# Patient Record
Sex: Female | Born: 1975 | Race: Black or African American | Hispanic: No | Marital: Single | Smoking: Former smoker
Health system: Southern US, Community
[De-identification: ages and names within clinical notes are randomized; demographics above are authoritative.]

## PROBLEM LIST (undated history)

## (undated) ENCOUNTER — Inpatient Hospital Stay (HOSPITAL_COMMUNITY): Payer: Self-pay

## (undated) DIAGNOSIS — T7840XA Allergy, unspecified, initial encounter: Secondary | ICD-10-CM

## (undated) DIAGNOSIS — J329 Chronic sinusitis, unspecified: Secondary | ICD-10-CM

## (undated) DIAGNOSIS — R87629 Unspecified abnormal cytological findings in specimens from vagina: Secondary | ICD-10-CM

## (undated) DIAGNOSIS — O24419 Gestational diabetes mellitus in pregnancy, unspecified control: Secondary | ICD-10-CM

## (undated) DIAGNOSIS — E079 Disorder of thyroid, unspecified: Secondary | ICD-10-CM

## (undated) HISTORY — DX: Unspecified abnormal cytological findings in specimens from vagina: R87.629

## (undated) HISTORY — DX: Disorder of thyroid, unspecified: E07.9

## (undated) HISTORY — DX: Gestational diabetes mellitus in pregnancy, unspecified control: O24.419

## (undated) HISTORY — DX: Allergy, unspecified, initial encounter: T78.40XA

---

## 2002-09-05 HISTORY — PX: COLPOSCOPY: SHX161

## 2003-08-05 ENCOUNTER — Other Ambulatory Visit: Admission: RE | Admit: 2003-08-05 | Discharge: 2003-08-05 | Payer: Self-pay | Admitting: Obstetrics and Gynecology

## 2004-03-16 ENCOUNTER — Encounter: Admission: RE | Admit: 2004-03-16 | Discharge: 2004-03-16 | Payer: Self-pay | Admitting: Obstetrics & Gynecology

## 2007-09-11 ENCOUNTER — Encounter: Admission: RE | Admit: 2007-09-11 | Discharge: 2007-09-11 | Payer: Self-pay | Admitting: Obstetrics and Gynecology

## 2007-10-09 ENCOUNTER — Inpatient Hospital Stay (HOSPITAL_COMMUNITY): Admission: AD | Admit: 2007-10-09 | Discharge: 2007-10-09 | Payer: Self-pay | Admitting: Obstetrics and Gynecology

## 2007-10-11 ENCOUNTER — Inpatient Hospital Stay (HOSPITAL_COMMUNITY): Admission: AD | Admit: 2007-10-11 | Discharge: 2007-10-11 | Payer: Self-pay | Admitting: Obstetrics and Gynecology

## 2007-11-02 ENCOUNTER — Inpatient Hospital Stay (HOSPITAL_COMMUNITY): Admission: AD | Admit: 2007-11-02 | Discharge: 2007-11-05 | Payer: Self-pay | Admitting: Obstetrics and Gynecology

## 2008-01-04 ENCOUNTER — Ambulatory Visit: Payer: Self-pay | Admitting: Family Medicine

## 2008-02-01 ENCOUNTER — Ambulatory Visit: Payer: Self-pay | Admitting: Family Medicine

## 2008-08-26 ENCOUNTER — Ambulatory Visit: Payer: Self-pay | Admitting: Family Medicine

## 2008-09-09 ENCOUNTER — Ambulatory Visit: Payer: Self-pay | Admitting: Family Medicine

## 2010-09-05 NOTE — L&D Delivery Note (Signed)
Delivery Note At 11:50 AM a viable and healthy female was delivered via Vaginal, Spontaneous Delivery (Presentation:OA ;  ).  APGAR: 9, 9; weight 7 lb 14.2 oz (3578 g).   Placenta status: spont intact not sent, .  Cord:  with the following complications: cord around neck loose reducible.  Cord pH: none  Anesthesia: Epidural  Episiotomy: None Lacerations: None Suture Repair: none Est. Blood Loss (mL): 300  Mom to postpartum.  Baby to nursery-stable.  [name] A 08/04/2011, 12:04 PM

## 2010-10-04 ENCOUNTER — Ambulatory Visit
Admission: RE | Admit: 2010-10-04 | Discharge: 2010-10-04 | Payer: Self-pay | Source: Home / Self Care | Attending: Family Medicine | Admitting: Family Medicine

## 2010-11-30 ENCOUNTER — Observation Stay (HOSPITAL_COMMUNITY)
Admission: EM | Admit: 2010-11-30 | Discharge: 2010-12-01 | Disposition: A | Discharge status: Home / Self Care | Payer: 59 | Attending: Emergency Medicine | Admitting: Emergency Medicine

## 2010-11-30 DIAGNOSIS — O211 Hyperemesis gravidarum with metabolic disturbance: Principal | ICD-10-CM | POA: Insufficient documentation

## 2010-11-30 DIAGNOSIS — E860 Dehydration: Secondary | ICD-10-CM | POA: Insufficient documentation

## 2010-11-30 LAB — DIFFERENTIAL
Basophils Relative: 0 % (ref 0–1)
Eosinophils Absolute: 0 10*3/uL (ref 0.0–0.7)
Neutrophils Relative %: 71 % (ref 43–77)

## 2010-11-30 LAB — BASIC METABOLIC PANEL
CO2: 27 mEq/L (ref 19–32)
Calcium: 8.5 mg/dL (ref 8.4–10.5)
Chloride: 96 mEq/L (ref 96–112)
Glucose, Bld: 131 mg/dL — ABNORMAL HIGH (ref 70–99)
Sodium: 132 mEq/L — ABNORMAL LOW (ref 135–145)

## 2010-11-30 LAB — URINALYSIS, ROUTINE W REFLEX MICROSCOPIC
Ketones, ur: >80 mg/dL — AB
Nitrite: NEGATIVE
Specific Gravity, Urine: 1.015 (ref 1.005–1.030)
pH: 6 (ref 5.0–8.0)

## 2010-11-30 LAB — CBC
MCH: 28.2 pg (ref 26.0–34.0)
Platelets: 272 10*3/uL (ref 150–400)
RBC: 4.4 MIL/uL (ref 3.87–5.11)
WBC: 15.4 10*3/uL — ABNORMAL HIGH (ref 4.0–10.5)

## 2010-11-30 LAB — URINE MICROSCOPIC-ADD ON

## 2010-12-01 LAB — POCT I-STAT, CHEM 8
BUN: <3 mg/dL — ABNORMAL LOW (ref 6–23)
HCT: 29 % — ABNORMAL LOW (ref 36.0–46.0)
Hemoglobin: 9.9 g/dL — ABNORMAL LOW (ref 12.0–15.0)
Sodium: 141 mEq/L (ref 135–145)
TCO2: 23 mmol/L (ref 0–100)

## 2010-12-01 LAB — URINE CULTURE
Colony Count: NO GROWTH
Culture  Setup Time: 201203271738
Culture: NO GROWTH

## 2011-02-22 LAB — HEPATITIS B SURFACE ANTIGEN: Hepatitis B Surface Ag: NEGATIVE

## 2011-02-22 LAB — ANTIBODY SCREEN: Antibody Screen: NEGATIVE

## 2011-02-22 LAB — HIV ANTIBODY (ROUTINE TESTING W REFLEX): HIV: NONREACTIVE

## 2011-03-16 ENCOUNTER — Encounter: Payer: 59 | Attending: Obstetrics and Gynecology

## 2011-03-16 DIAGNOSIS — Z713 Dietary counseling and surveillance: Secondary | ICD-10-CM | POA: Insufficient documentation

## 2011-03-16 DIAGNOSIS — O99810 Abnormal glucose complicating pregnancy: Secondary | ICD-10-CM | POA: Insufficient documentation

## 2011-03-17 NOTE — Progress Notes (Signed)
  Patient was seen on 03/16/2011 for Gestational Diabetes self-management class at the [facility]. The following learning objectives were met by the patient during this course:   States the definition of Gestational Diabetes  States why dietary management is important in controlling blood glucose  Describes the effects each nutrient has on blood glucose levels  Demonstrates ability to create a balanced meal plan  Demonstrates carbohydrate counting   States when to check blood glucose levels  Demonstrates proper blood glucose monitoring techniques  States the effect of stress and exercise on blood glucose levels  States the importance of limiting caffeine and abstaining from alcohol and smoking  Blood glucose monitor given: Accu-Check-Nano  Lot # [id number] Exp: 07/05/2012 Blood glucose reading: 110  Patient instructed to monitor glucose levels: FBS: 60 - <90 2 hour: <120  Patient will be seen for follow-up as needed.

## 2011-05-02 ENCOUNTER — Encounter: Payer: Self-pay | Admitting: Family Medicine

## 2011-05-27 LAB — CBC
HCT: 35.7 — ABNORMAL LOW
MCHC: 34
MCV: 85.7
Platelets: 292
RDW: 15.4

## 2011-05-27 LAB — STREP B DNA PROBE

## 2011-05-27 LAB — RPR: RPR Ser Ql: NONREACTIVE

## 2011-05-30 LAB — CBC
MCHC: 34.1
Platelets: 252
RDW: 15.3

## 2011-06-30 ENCOUNTER — Encounter: Payer: Self-pay | Admitting: Internal Medicine

## 2011-07-01 ENCOUNTER — Ambulatory Visit (INDEPENDENT_AMBULATORY_CARE_PROVIDER_SITE_OTHER)
Admission: RE | Admit: 2011-07-01 | Discharge: 2011-07-01 | Disposition: A | Discharge status: Home / Self Care | Payer: 59 | Source: Ambulatory Visit | Attending: Internal Medicine | Admitting: Internal Medicine

## 2011-07-01 ENCOUNTER — Encounter: Payer: Self-pay | Admitting: Internal Medicine

## 2011-07-01 ENCOUNTER — Ambulatory Visit (INDEPENDENT_AMBULATORY_CARE_PROVIDER_SITE_OTHER): Payer: 59 | Admitting: Internal Medicine

## 2011-07-01 VITALS — BP 100/64 | HR 80 | Temp 98.3°F | Ht 67.0 in | Wt 184.0 lb

## 2011-07-01 DIAGNOSIS — R0609 Other forms of dyspnea: Secondary | ICD-10-CM

## 2011-07-01 DIAGNOSIS — R0600 Dyspnea, unspecified: Secondary | ICD-10-CM

## 2011-07-01 NOTE — Patient Instructions (Signed)
No evidence of asthma, blood clots or heart failure - you may just need to pace yourself a little slower as you gain more weight with your pregnancy  You need an up to date cbc and bmet and tsh to be complete   If you notice  A lot more difficulty walk surface at the pace you did for [place] today we need to see you right away.

## 2011-07-01 NOTE — Progress Notes (Signed)
Quick Note:  Spoke with pt and notified of results per Dr. Wert. Pt verbalized understanding and denied any questions.  ______ 

## 2011-07-01 NOTE — Progress Notes (Signed)
  Subjective:    Patient ID: Pamela [name], female    DOB: 22-Aug-1976, 35 y.o.   MRN: [id number]  HPI  27 yobf never smoker never sign respiratory problems including 2009 with 1st pregnancy referred to pulmonary clinic by Dr Cherly Hensen for sob Oct 2012 onset at [redacted] weeks gestation.   07/01/2011 Initial pulmonary office eval @ 34 weeks gestaton  cc Indolent onset persistent not progressive x 4 weeks sense of sob typically in am  [place] but directly related or proportional  to activity, sometimes takes an hour to recover And also when lie down now has to lie on 2 pillows not true with first pregnancy maybe 5 lbs heavier on this one. . Nasal congestion no worse than usual.  No ex/ pleuritic cp or leg swelling or h/o hbp or proteinuria with this pregnancy nor last one.   Denies  early am exacerbation  of respiratory  c/o's and has never used saba. Also denies any obvious fluctuation of symptoms with weather or environmental changes or other aggravating or alleviating factors except as outlined above   Review of Systems  Constitutional: Negative for fever, chills and unexpected weight change.  HENT: Positive for congestion and sneezing. Negative for ear pain, nosebleeds, sore throat, rhinorrhea, trouble swallowing, dental problem, voice change, postnasal drip and sinus pressure.   Eyes: Negative for visual disturbance.  Respiratory: Positive for shortness of breath. Negative for cough and choking.   Cardiovascular: Negative for chest pain and leg swelling.  Gastrointestinal: Negative for vomiting, abdominal pain and diarrhea.  Genitourinary: Negative for difficulty urinating.  Musculoskeletal: Negative for arthralgias.  Skin: Negative for rash.  Neurological: Negative for tremors, syncope and headaches.  Hematological: Does not bruise/bleed easily.       Objective:   Physical Exam  amb bf nad  Wt 184 07/01/2011  ] HEENT: nl dentition, turbinates, and orophanx. Nl external ear canals without  cough reflex   NECK :  without JVD/Nodes/TM/ nl carotid upstrokes bilaterally   LUNGS: no acc muscle use, clear to A and P bilaterally without cough on insp or exp maneuvers   CV:  RRR  no s3 or murmur or increase in P2, no edema   ABD:  soft and nontender with nl excursion in the supine position c/w near term pregnancy. No bruits or organomegaly, bowel sounds nl  MS:  warm without deformities, calf tenderness, cyanosis or clubbing  SKIN: warm and dry without lesions    NEURO:  alert, approp, no deficits     CXR  07/01/2011 :  Lateral aspect of the right lung is not completely incorporate on present exam. Taking this limitation into account, no evidence of infiltrate, congestive heart failure or pneumothorax.      Assessment & Plan:

## 2011-07-01 NOTE — Assessment & Plan Note (Signed)
Symptoms are markedly disproportionate to objective findings and not clear this is a lung problem but pt does appear to have difficult airway management issues.   DDX of  difficult airways managment all start with A and  include Adherence, Ace Inhibitors, Acid Reflux, Active Sinus Disease, Alpha 1 Antitripsin deficiency, Anxiety masquerading as Airways dz,  ABPA,  allergy(esp in young), Aspiration (esp in elderly), Adverse effects of DPI,  Active smokers, plus two Bs  = Bronchiectasis and Beta blocker use..and one C= CHF   ? Anxiety, dx of exclusion but may be playing a role along with effects of progesterone, wt gain in abd compartment  ? Acid reflux > consider trial of ppi if worsens  See instructions for specific recommendations which were reviewed directly with the patient who was given a copy with highlighter outlining the key components.

## 2011-07-25 ENCOUNTER — Encounter (HOSPITAL_COMMUNITY): Payer: Self-pay | Admitting: *Deleted

## 2011-07-25 ENCOUNTER — Telehealth (HOSPITAL_COMMUNITY): Payer: Self-pay | Admitting: *Deleted

## 2011-07-25 NOTE — Telephone Encounter (Signed)
Preadmission screen  

## 2011-08-04 ENCOUNTER — Encounter (HOSPITAL_COMMUNITY): Payer: Self-pay

## 2011-08-04 ENCOUNTER — Encounter (HOSPITAL_COMMUNITY): Payer: Self-pay | Admitting: Anesthesiology

## 2011-08-04 ENCOUNTER — Inpatient Hospital Stay (HOSPITAL_COMMUNITY)
Admission: AD | Admit: 2011-08-04 | Discharge: 2011-08-06 | DRG: 775 | Disposition: A | Discharge status: Home / Self Care | Payer: 59 | Source: Ambulatory Visit | Attending: Obstetrics and Gynecology | Admitting: Obstetrics and Gynecology

## 2011-08-04 ENCOUNTER — Inpatient Hospital Stay (HOSPITAL_COMMUNITY): Payer: 59 | Admitting: Anesthesiology

## 2011-08-04 DIAGNOSIS — IMO0001 Reserved for inherently not codable concepts without codable children: Secondary | ICD-10-CM

## 2011-08-04 DIAGNOSIS — O99814 Abnormal glucose complicating childbirth: Principal | ICD-10-CM | POA: Diagnosis present

## 2011-08-04 DIAGNOSIS — R0600 Dyspnea, unspecified: Secondary | ICD-10-CM

## 2011-08-04 DIAGNOSIS — O24419 Gestational diabetes mellitus in pregnancy, unspecified control: Secondary | ICD-10-CM | POA: Diagnosis present

## 2011-08-04 DIAGNOSIS — O9903 Anemia complicating the puerperium: Secondary | ICD-10-CM | POA: Diagnosis not present

## 2011-08-04 DIAGNOSIS — D62 Acute posthemorrhagic anemia: Secondary | ICD-10-CM | POA: Diagnosis not present

## 2011-08-04 LAB — CBC
HCT: 37 % (ref 36.0–46.0)
HCT: 30.6 % — ABNORMAL LOW (ref 36.0–46.0)
Hemoglobin: 12.3 g/dL (ref 12.0–15.0)
Hemoglobin: 9.8 g/dL — ABNORMAL LOW (ref 12.0–15.0)
MCH: 28.3 pg (ref 26.0–34.0)
MCHC: 33.2 g/dL (ref 30.0–36.0)
MCHC: 32 g/dL (ref 30.0–36.0)
MCV: 85.3 fL (ref 78.0–100.0)
MCV: 86 fL (ref 78.0–100.0)
Platelets: 299 10*3/uL (ref 150–400)
RDW: 15.5 % (ref 11.5–15.5)
WBC: 7.3 10*3/uL (ref 4.0–10.5)

## 2011-08-04 LAB — COMPREHENSIVE METABOLIC PANEL
Alkaline Phosphatase: 116 U/L (ref 39–117)
BUN: 6 mg/dL (ref 6–23)
Creatinine, Ser: 0.62 mg/dL (ref 0.50–1.10)
GFR calc Af Amer: >90 mL/min (ref >90)
Glucose, Bld: 155 mg/dL — ABNORMAL HIGH (ref 70–99)
Potassium: 3.5 mEq/L (ref 3.5–5.1)
Total Bilirubin: 0.3 mg/dL (ref 0.3–1.2)
Total Protein: 5.6 g/dL — ABNORMAL LOW (ref 6.0–8.3)

## 2011-08-04 LAB — ABO/RH: ABO/RH(D): O POS

## 2011-08-04 LAB — GLUCOSE, CAPILLARY: Glucose-Capillary: 100 mg/dL — ABNORMAL HIGH (ref 70–99)

## 2011-08-04 LAB — RPR: RPR Ser Ql: NONREACTIVE

## 2011-08-04 LAB — URIC ACID: Uric Acid, Serum: 4.8 mg/dL (ref 2.4–7.0)

## 2011-08-04 MED ORDER — EPHEDRINE 5 MG/ML INJ
10.0000 mg | INTRAVENOUS | Status: DC | PRN
  Administered 2011-08-04: 10 mg via INTRAVENOUS
  Filled 2011-08-04: qty 4

## 2011-08-04 MED ORDER — IBUPROFEN 600 MG PO TABS: 600.0000 mg | ORAL_TABLET | Freq: Four times a day (QID) | ORAL | Status: DC | PRN

## 2011-08-04 MED ORDER — FLEET ENEMA 7-19 GM/118ML RE ENEM: 1.0000 | ENEMA | RECTAL | Status: DC | PRN

## 2011-08-04 MED ORDER — CITRIC ACID-SODIUM CITRATE 334-500 MG/5ML PO SOLN: 30.0000 mL | ORAL | Status: DC | PRN

## 2011-08-04 MED ORDER — LACTATED RINGERS IV SOLN
500.0000 mL | INTRAVENOUS | Status: DC | PRN
  Administered 2011-08-04: 300 mL via INTRAVENOUS

## 2011-08-04 MED ORDER — FENTANYL 2.5 MCG/ML BUPIVACAINE 1/10 % EPIDURAL INFUSION (WH - ANES)
14.0000 mL/h | INTRAMUSCULAR | Status: DC
  Administered 2011-08-04: 14 mL/h via EPIDURAL
  Filled 2011-08-04: qty 60

## 2011-08-04 MED ORDER — LACTATED RINGERS IV SOLN
INTRAVENOUS | Status: DC
  Administered 2011-08-04: 125 mL/h via INTRAVENOUS
  Administered 2011-08-04: 07:00:00 via INTRAVENOUS

## 2011-08-04 MED ORDER — LACTATED RINGERS IV SOLN
500.0000 mL | Freq: Once | INTRAVENOUS | Status: AC
  Administered 2011-08-04: 500 mL via INTRAVENOUS

## 2011-08-04 MED ORDER — DIPHENHYDRAMINE HCL 50 MG/ML IJ SOLN: 12.5000 mg | INTRAMUSCULAR | Status: DC | PRN

## 2011-08-04 MED ORDER — OXYTOCIN 20 UNITS IN LACTATED RINGERS INFUSION - SIMPLE
1.0000 m[IU]/min | INTRAVENOUS | Status: DC
  Administered 2011-08-04: 2 m[IU]/min via INTRAVENOUS

## 2011-08-04 MED ORDER — BENZOCAINE-MENTHOL 20-0.5 % EX AERO
INHALATION_SPRAY | CUTANEOUS | Status: AC
  Administered 2011-08-04: 1 via TOPICAL
  Filled 2011-08-04: qty 56

## 2011-08-04 MED ORDER — OXYCODONE-ACETAMINOPHEN 5-325 MG PO TABS: 2.0000 | ORAL_TABLET | ORAL | Status: DC | PRN

## 2011-08-04 MED ORDER — EPHEDRINE 5 MG/ML INJ: 10.0000 mg | INTRAVENOUS | Status: DC | PRN

## 2011-08-04 MED ORDER — PHENYLEPHRINE 40 MCG/ML (10ML) SYRINGE FOR IV PUSH (FOR BLOOD PRESSURE SUPPORT): 80.0000 ug | PREFILLED_SYRINGE | INTRAVENOUS | Status: DC | PRN

## 2011-08-04 MED ORDER — LANOLIN HYDROUS EX OINT: TOPICAL_OINTMENT | CUTANEOUS | Status: DC | PRN

## 2011-08-04 MED ORDER — OXYTOCIN 20 UNITS IN LACTATED RINGERS INFUSION - SIMPLE: 125.0000 mL/h | Freq: Once | INTRAVENOUS | Status: DC

## 2011-08-04 MED ORDER — ZOLPIDEM TARTRATE 5 MG PO TABS: 5.0000 mg | ORAL_TABLET | Freq: Every evening | ORAL | Status: DC | PRN

## 2011-08-04 MED ORDER — WITCH HAZEL-GLYCERIN EX PADS: 1.0000 "application " | MEDICATED_PAD | CUTANEOUS | Status: DC | PRN

## 2011-08-04 MED ORDER — TETANUS-DIPHTH-ACELL PERTUSSIS 5-2.5-18.5 LF-MCG/0.5 IM SUSP: 0.5000 mL | Freq: Once | INTRAMUSCULAR | Status: DC

## 2011-08-04 MED ORDER — FERROUS SULFATE 325 (65 FE) MG PO TABS
325.0000 mg | ORAL_TABLET | Freq: Two times a day (BID) | ORAL | Status: DC
  Administered 2011-08-04 – 2011-08-06 (×4): 325 mg via ORAL
  Filled 2011-08-04 (×4): qty 1

## 2011-08-04 MED ORDER — ONDANSETRON HCL 4 MG PO TABS: 4.0000 mg | ORAL_TABLET | ORAL | Status: DC | PRN

## 2011-08-04 MED ORDER — ACETAMINOPHEN 325 MG PO TABS: 650.0000 mg | ORAL_TABLET | ORAL | Status: DC | PRN

## 2011-08-04 MED ORDER — SENNOSIDES-DOCUSATE SODIUM 8.6-50 MG PO TABS
2.0000 | ORAL_TABLET | Freq: Every day | ORAL | Status: DC
  Administered 2011-08-04 – 2011-08-05 (×2): 2 via ORAL

## 2011-08-04 MED ORDER — DIPHENHYDRAMINE HCL 25 MG PO CAPS: 25.0000 mg | ORAL_CAPSULE | Freq: Four times a day (QID) | ORAL | Status: DC | PRN

## 2011-08-04 MED ORDER — ONDANSETRON HCL 4 MG/2ML IJ SOLN: 4.0000 mg | INTRAMUSCULAR | Status: DC | PRN

## 2011-08-04 MED ORDER — TERBUTALINE SULFATE 1 MG/ML IJ SOLN: 0.2500 mg | Freq: Once | INTRAMUSCULAR | Status: DC | PRN

## 2011-08-04 MED ORDER — DIBUCAINE 1 % RE OINT: 1.0000 "application " | TOPICAL_OINTMENT | RECTAL | Status: DC | PRN

## 2011-08-04 MED ORDER — LIDOCAINE HCL 1.5 % IJ SOLN
INTRAMUSCULAR | Status: DC | PRN
  Administered 2011-08-04 (×2): 5 mL via EPIDURAL

## 2011-08-04 MED ORDER — OXYCODONE-ACETAMINOPHEN 5-325 MG PO TABS
1.0000 | ORAL_TABLET | ORAL | Status: DC | PRN
  Administered 2011-08-04 – 2011-08-06 (×6): 1 via ORAL
  Filled 2011-08-04 (×6): qty 1

## 2011-08-04 MED ORDER — PRENATAL PLUS 27-1 MG PO TABS
1.0000 | ORAL_TABLET | Freq: Every day | ORAL | Status: DC
  Administered 2011-08-05 – 2011-08-06 (×2): 1 via ORAL
  Filled 2011-08-04 (×2): qty 1

## 2011-08-04 MED ORDER — PHENYLEPHRINE 40 MCG/ML (10ML) SYRINGE FOR IV PUSH (FOR BLOOD PRESSURE SUPPORT)
80.0000 ug | PREFILLED_SYRINGE | INTRAVENOUS | Status: DC | PRN
  Filled 2011-08-04: qty 5

## 2011-08-04 MED ORDER — LIDOCAINE HCL (PF) 1 % IJ SOLN
30.0000 mL | INTRAMUSCULAR | Status: DC | PRN
  Filled 2011-08-04: qty 30

## 2011-08-04 MED ORDER — BENZOCAINE-MENTHOL 20-0.5 % EX AERO
1.0000 "application " | INHALATION_SPRAY | CUTANEOUS | Status: DC | PRN
  Administered 2011-08-04: 1 via TOPICAL

## 2011-08-04 MED ORDER — ONDANSETRON HCL 4 MG/2ML IJ SOLN: 4.0000 mg | Freq: Four times a day (QID) | INTRAMUSCULAR | Status: DC | PRN

## 2011-08-04 MED ORDER — OXYTOCIN BOLUS FROM INFUSION
500.0000 mL | Freq: Once | INTRAVENOUS | Status: DC
  Filled 2011-08-04: qty 1000
  Filled 2011-08-04: qty 500

## 2011-08-04 MED ORDER — SIMETHICONE 80 MG PO CHEW: 80.0000 mg | CHEWABLE_TABLET | ORAL | Status: DC | PRN

## 2011-08-04 MED ORDER — IBUPROFEN 600 MG PO TABS
600.0000 mg | ORAL_TABLET | Freq: Four times a day (QID) | ORAL | Status: DC
  Administered 2011-08-04 – 2011-08-06 (×8): 600 mg via ORAL
  Filled 2011-08-04 (×8): qty 1

## 2011-08-04 NOTE — Progress Notes (Signed)
Pamela [name] is a 35 y.o. G3P1011 at [redacted]w[redacted]d by ultrasound admitted for active labor  Subjective: Chief Complaint  Patient presents with  . Contractions    Objective: BP 113/61  Pulse 74  Temp(Src) 97.6 F (36.4 C) (Axillary)  Resp 16  Ht 5\' 7"  (1.702 m)  Wt 86.637 kg (191 lb)  BMI 29.91 kg/m2  SpO2 100%   Total I/O In: -  Out: 950 [Urine:950]  FHT:  FHR: 140 bpm, variability: moderate,  accelerations:  Present,  decelerations:  Present variable decel UC:   regular, every 2-3 minutes SVE:   Fully -2 station Labs: Lab Results  Component Value Date   WBC 7.3 08/04/2011   HGB 12.3 08/04/2011   HCT 37.0 08/04/2011   MCV 85.3 08/04/2011   PLT 299 08/04/2011    Assessment / Plan: Spontaneous labor, progressing normally Class A2 GDM  IUP @ 39 2/7 wk P) right exaggerated sims . Start pushing@ 11: 30 am  Anticipated MOD:  NSVD  Pamela [name] A 08/04/2011, 11:33 AM

## 2011-08-04 NOTE — Progress Notes (Signed)
Patient ID: Pamela [name], female   DOB: 29-Jul-1976, 35 y.o.   MRN: [id number] S: Urge to push w. Ctx, desires epidural Srom w/ cl AF at 0801   O: BP 116/66  Pulse 88  Temp(Src) 98.1 F (36.7 C) (Oral)  Resp 18  Ht 5\' 7"  (1.702 m)  Wt 86.637 kg (191 lb)  BMI 29.91 kg/m2  SpO2 98%   FHT:  FHR: 140 bpm, variability: moderate,  accelerations:  Abscent,  decelerations:  Present variables, nadir 100 UC:  irregular, every 2-4 min SVE:   Dilation: Lip/rim Effacement (%): 80 Station: -1 Exam by:: Joy, B RN asynclitic vertex  A / P: IUP at term, spont labor, progressing rapidly  Fetal Wellbeing:  Category I Pain Control:  Epidural if patient able to tolerate  Anticipated MOD:  NSVD  Caedon Bond 08/04/2011, 8:20 AM

## 2011-08-04 NOTE — Anesthesia Procedure Notes (Signed)
Epidural Patient location during procedure: OB Start time: 08/04/2011 8:28 AM  Staffing Performed by: anesthesiologist   Preanesthetic Checklist Completed: patient identified, site marked, surgical consent, pre-op evaluation, timeout performed, IV checked, risks and benefits discussed and monitors and equipment checked  Epidural Patient position: sitting Prep: site prepped and draped and DuraPrep Patient monitoring: continuous pulse ox and blood pressure Approach: midline Injection technique: LOR air and LOR saline  Needle:  Needle type: Tuohy  Needle gauge: 17 G Needle length: 9 cm Needle insertion depth: 5 cm cm Catheter type: closed end flexible Catheter size: 19 Gauge Catheter at skin depth: 10 cm Test dose: negative  Assessment Events: blood not aspirated, injection not painful, no injection resistance, negative IV test and no paresthesia  Additional Notes Patient identified.  Risk benefits discussed including failed block, incomplete pain control, headache, nerve damage, paralysis, blood pressure changes, nausea, vomiting, reactions to medication both toxic or allergic, and postpartum back pain.  Patient expressed understanding and wished to proceed.  All questions were answered.  Sterile technique used throughout procedure and epidural site dressed with sterile barrier dressing. No paresthesia or other complications noted.The patient did not experience any signs of intravascular injection such as tinnitus or metallic taste in mouth nor signs of intrathecal spread such as rapid motor block. Please see nursing notes for vital signs.

## 2011-08-04 NOTE — H&P (Signed)
  Pamela [name] is a 35 y.o. G3P1011 at [redacted]w[redacted]d presenting for contractions. Pt notes strong contractions started early morning . Good fetal movement, No vaginal bleeding, no leaking fluid.  Prenatal Course Source of Care: [facility]  with onset of care at 15 weeks Pregnancy complications or risk: GDM A2 Current medications: Glyburide, PNV  OB History    Grav Para Term Preterm Abortions TAB SAB Ect Mult Living   3 1 1  1 1    1      Past Medical History  Diagnosis Date  . Allergy     RHINITIS  . Thyroid disease     THYROID CYST  . Diabetes mellitus   . Abnormal Pap smear    Past Surgical History  Procedure Date  . Colposcopy 2004   Family History: family history includes Arthritis in her paternal grandfather and paternal grandmother; Asthma in her sister; Diabetes in her father and paternal grandmother; Heart disease in her father; Hypertension in her father, paternal grandfather, and paternal grandmother; Multiple sclerosis in her mother; Prostate cancer in her father; and Stroke in an unspecified family member. Social History:  reports that she quit smoking about 4 years ago. Her smoking use included Cigars. She has never used smokeless tobacco. She reports that she does not drink alcohol or use illicit drugs.  Review of Systems - Negative except as noted   Dilation: 7.5 Effacement (%): 80 Station: -1 Exam by:: Joy, B RN, vertex Blood pressure 116/66, pulse 88, temperature 98.7 F (37.1 C), temperature source Oral, resp. rate 18, height 5\' 7"  (1.702 m), weight 86.637 kg (191 lb), SpO2 98.00%.  Physical Exam: Blood pressure 116/66, pulse 88, temperature 98.7 F (37.1 C), temperature source Oral, resp. rate 18, height 5\' 7"  (1.702 m), weight 86.637 kg (191 lb), SpO2 98.00%. General: NAD Heart: RRR, no murmurs Lungs: CTA b/l  Abd: Soft, NT, EFW 7-12 by sono  37 wks  Ext: no edema Neuro: DTRs normal Other:    Membranes: intact Vaginal bleeding: none Speculum Exam: n/a  FHR:   Baseline rate 140   Variability moderate  Accelerations present  Decelerations variables, mild Contractions: Frequency 2-3  Duration 60+  Intensity strong  Pertinent Labs/Studies:  Lab Results  Component Value Date   WBC 7.3 08/04/2011   HGB 12.3 08/04/2011   HCT 37.0 08/04/2011   MCV 85.3 08/04/2011   PLT 299 08/04/2011  CBG 105 Prenatal labs: ABO, Rh: O/--/-- (06/19 0000) Antibody: Negative (06/19 0000) Rubella:  immune RPR: Nonreactive (06/19 0000)  HBsAg: Negative (06/19 0000)  HIV: Non-reactive (06/19 0000)  GBS: Negative (11/07 0000)  Failed 3 GTT  Genetic screening quad screen neg Ultrasound: Weeks 18 Result nl anatomy, f/u fetal echo WNL  Assessment: 35 y.o. G3P1011 at [redacted]w[redacted]d  1. Labor: active, rapid progression 2. Fetal Wellbeing: Category 1  3. Pain Control: labor support, desires epidural 4. GBS: neg   Plan:  1. Admit to BS 2. Routine L&D orders 3. Analgesia/anesthesia PRN  4. CNM standby for delivery    Consultant: Dr. Alesia Banda 08/04/2011, 7:56 AM

## 2011-08-04 NOTE — Anesthesia Preprocedure Evaluation (Signed)
Anesthesia Evaluation  Patient identified by MRN, date of birth, ID band Patient awake    Reviewed: Allergy & Precautions, H&P , Patient's Chart, lab work & pertinent test results  Airway Mallampati: II TM Distance: >3 FB Neck ROM: full    Dental No notable dental hx.    Pulmonary neg pulmonary ROS, shortness of breath,  clear to auscultation  Pulmonary exam normal       Cardiovascular neg cardio ROS regular Normal    Neuro/Psych Negative Neurological ROS  Negative Psych ROS   GI/Hepatic negative GI ROS, Neg liver ROS,   Endo/Other  Negative Endocrine ROSDiabetes mellitus-  Renal/GU negative Renal ROS     Musculoskeletal   Abdominal   Peds  Hematology negative hematology ROS (+)   Anesthesia Other Findings   Reproductive/Obstetrics (+) Pregnancy                           Anesthesia Physical Anesthesia Plan  ASA: III  Anesthesia Plan: Epidural   Post-op Pain Management:    Induction:   Airway Management Planned:   Additional Equipment:   Intra-op Plan:   Post-operative Plan:   Informed Consent: I have reviewed the patients History and Physical, chart, labs and discussed the procedure including the risks, benefits and alternatives for the proposed anesthesia with the patient or authorized representative who has indicated his/her understanding and acceptance.     Plan Discussed with:   Anesthesia Plan Comments:         Anesthesia Quick Evaluation

## 2011-08-05 ENCOUNTER — Encounter (HOSPITAL_COMMUNITY): Payer: Self-pay | Admitting: Obstetrics and Gynecology

## 2011-08-05 LAB — CBC
Hemoglobin: 10.3 g/dL — ABNORMAL LOW (ref 12.0–15.0)
MCHC: 32.6 g/dL (ref 30.0–36.0)
RDW: 15.9 % — ABNORMAL HIGH (ref 11.5–15.5)

## 2011-08-05 NOTE — Progress Notes (Signed)
Patient ID: Pamela [name], female   DOB: 01-Mar-1976, 35 y.o.   MRN: [id number] PPD # 1  Subjective: Pt reports feeling well/ Pain controlled with prescription NSAID's including motrin and narcotic analgesics including percocet Tolerating po/ Voiding without problems/ No n/v Bleeding is moderate Newborn info:  Information for the patient's newborn:  Alayja, Armas [[id number]]  female  / circ planned for today/ Feeding: breast/some instability in blood glucose, but stable overall    Objective:  VS: Blood pressure 99/64, pulse 98, temperature 98.1 F (36.7 C), temperature source Oral, resp. rate 18, height 5\' 7"  (1.702 m), weight 86.637 kg (191 lb), SpO2 100.00%, unknown if currently breastfeeding.    Basename 08/05/11 0535 08/04/11 1632  WBC 12.2* 11.4*  HGB 10.3* 9.8*  HCT 31.6* 30.6*  PLT 257 251    Blood type: --/--/O POS (11/29 0715) Rubella: Immune (06/19 0000)    Physical Exam:  General: alert, cooperative and no distress CV: Regular rate and rhythm Resp: clear Abdomen: soft, nontender, normal bowel sounds Uterine Fundus: firm, below umbilicus, nontender Perineum: intact; mild erythema Lochia: moderate Ext: Homans sign is negative, no sign of DVT and no edema, redness or tenderness in the calves or thighs   A/P: PPD # 1/ [id number] Hx GDM (A1) Stable Mild ABL Anemia; on iron supp. Doing well Continue routine post partum orders Anticipate discharge home in AM   Signed: Arlana Lindau, [facility] 08/05/11  910am

## 2011-08-05 NOTE — Anesthesia Postprocedure Evaluation (Signed)
Anesthesia Post Note  Patient: Pamela [name]  Procedure(s) Performed: * No procedures listed *  Anesthesia type: Epidural  Patient location: Mother/Baby  Post pain: Pain level controlled  Post assessment: Post-op Vital signs reviewed  Last Vitals:  Filed Vitals:   08/05/11 0517  BP: 99/64  Pulse: 98  Temp: 36.7 C  Resp: 18    Post vital signs: Reviewed  Level of consciousness:alert  Complications: No apparent anesthesia complications

## 2011-08-06 ENCOUNTER — Inpatient Hospital Stay (HOSPITAL_COMMUNITY): Admission: RE | Admit: 2011-08-06 | Payer: 59 | Source: Ambulatory Visit

## 2011-08-06 LAB — GLUCOSE, CAPILLARY: Glucose-Capillary: 92 mg/dL (ref 70–99)

## 2011-08-06 MED ORDER — IBUPROFEN 600 MG PO TABS: 600.0000 mg | ORAL_TABLET | Freq: Four times a day (QID) | ORAL | Status: DC

## 2011-08-06 MED ORDER — OXYCODONE-ACETAMINOPHEN 5-325 MG PO TABS: 1.0000 | ORAL_TABLET | Freq: Four times a day (QID) | ORAL | Status: DC | PRN

## 2011-08-06 NOTE — Discharge Summary (Signed)
Obstetric Discharge Summary Reason for Admission: onset of labor and Hx GDM A2 Prenatal Procedures: NST and ultrasound Intrapartum Procedures: spontaneous vaginal delivery Postpartum Procedures: none Complications-Operative and Postpartum: none Hemoglobin  Date Value Range Status  08/05/2011 10.3* 12.0-15.0 (g/dL) Final     HCT  Date Value Range Status  08/05/2011 31.6* 36.0-46.0 (%) Final    Discharge Diagnoses: Term Pregnancy-delivered, Class A2 GDM  Discharge Information: Date: 08/06/2011 Activity: pelvic rest Diet: routine Medications: None Condition: stable Instructions: refer to practice specific booklet Discharge to: home Follow-up Information    Follow up with Neesha Langton A, MD in 6 weeks.   Contact information:   [address] [place] [address] [phone]          Newborn Data: Live born female on 08/04/11 Birth Weight: 7 lb 14.2 oz (3578 g) APGAR: 9, 9  Home with mother.  FISHER,JULIE K 08/06/2011, 10:42 AM

## 2011-08-06 NOTE — Progress Notes (Signed)
Patient ID: Pamela [name], female   DOB: 11-Mar-1976, 35 y.o.   MRN: [id number] PPD # 2  Subjective: Pt reports feeling "ok"; Desires d/c home, but still has concerns/ Pain controlled with prescription NSAID's including motrin and narcotic analgesics including percocet Tolerating po/ Voiding without problems/ No n/v Bleeding is light/ Newborn info:  Information for the patient's newborn:  [name], Mehlhaff [[id number]]  female Circ completed yesterday/ Feeding: breast    Objective:  VS: Blood pressure 109/63, pulse 88, temperature 98 F (36.7 C), temperature source Oral, resp. rate 18, height 5\' 7"  (1.702 m), weight 86.637 kg (191 lb), SpO2 100.00%, unknown if currently breastfeeding.    Basename 08/05/11 0535 08/04/11 1632  WBC 12.2* 11.4*  HGB 10.3* 9.8*  HCT 31.6* 30.6*  PLT 257 251    Blood type: --/--/O POS (11/29 0715) Rubella: Immune (06/19 0000)    Physical Exam:  General: alert, cooperative and no distress Abdomen: soft, nontender, normal bowel sounds Uterine Fundus: firm, below umbilicus, nontender Perineum: not inspected Lochia: minimal Ext: edema trace and Homans sign is negative, no sign of DVT   A/P: PPD # 2/ [id number] Doing well and stable for discharge home RX: Ibuprofen 600mg  po Q 6 hrs prn pain #30 Refill x 1 Ferralet 90 1 po QD #30 Refill x 1 Percocet 5/325 1 to 2 po Q 4 hrs prn pain #15 No refill WOB/GYN booklet given Routine pp visit in 6wks  Signed: Arlana Lindau, [facility] 08/06/11 1030

## 2011-08-08 ENCOUNTER — Other Ambulatory Visit (INDEPENDENT_AMBULATORY_CARE_PROVIDER_SITE_OTHER): Payer: 59

## 2011-08-08 DIAGNOSIS — Z23 Encounter for immunization: Secondary | ICD-10-CM

## 2011-08-09 ENCOUNTER — Encounter: Payer: Self-pay | Admitting: Medical

## 2011-08-09 ENCOUNTER — Ambulatory Visit (INDEPENDENT_AMBULATORY_CARE_PROVIDER_SITE_OTHER): Payer: 59 | Admitting: Medical

## 2011-08-09 DIAGNOSIS — J111 Influenza due to unidentified influenza virus with other respiratory manifestations: Secondary | ICD-10-CM | POA: Insufficient documentation

## 2011-08-09 NOTE — Progress Notes (Signed)
Subjective:   HPI  Pamela [name] is a 35 y.o. female who presents with possible flu.  Her 8-year-old son was diagnosed with the flu-type A through urgent care this week and was put on Tamiflu.   She notes that she has the same symptoms now, with runny nose, throat itchy, fever subjective, headache, no appetite, sweats, aches, cough, no belly pain, no nausea, vomiting, or diarrhea. She actually came in here for a flu shot yesterday.  Currently using ibuprofen.  She also delivered her baby last week, and pregnancy was complicated by gestational diabetes, but this is resolved.  She is nursing.  No other aggravating or relieving factors.    No other c/o.  The following portions of the patient's history were reviewed and updated as appropriate: allergies, current medications, past family history, past medical history, past social history, past surgical history and problem list.  Past Medical History  Diagnosis Date  . Allergy     RHINITIS  . Thyroid disease     THYROID CYST  . Diabetes mellitus   . Abnormal Pap smear   . Postpartum care following vaginal delivery (08/04/11) 08/05/2011   Review of Systems Constitutional: +fever, +chills, +sweats, -unexpected -weight change,-fatigue ENT: +runny nose, +ear pain, +sore throat Cardiology:  -chest pain, -palpitations, -edema Respiratory: +cough, -shortness of breath, -wheezing Gastroenterology: -abdominal pain, -nausea, -vomiting, -diarrhea, -constipation Hematology: -bleeding or bruising problems Musculoskeletal: -arthralgias, -myalgias, -joint swelling, -back pain Ophthalmology: -vision changes Urology: -dysuria, -difficulty urinating, -hematuria, -urinary frequency, -urgency Neurology: +headache, +weakness, -tingling, -numbness   Objective:     General: Ill-appearing, well-developed, well-nourished Skin: warm, dry HEENT: Nose inflamed and congested, clear conjunctiva, TMs pearly, no sinus tenderness, pharynx with erythema, no  exudates Neck: Supple, nontender, shotty cervical adenopathy Heart: Regular rate and rhythm, normal S1, S2, no murmurs Lungs: Clear to auscultation bilaterally, no wheezes, rales, rhonchi Abdomen: Nontender non distended Extremities: Mild generalized tenderness     Assessment and Plan:   Encounter Diagnosis  Name Primary?  . Influenza Yes   Advise that her symptoms and sick contact with the flu was suggested she also has influenza. I advised that the risk of Tamiflu or other medication would outweigh the benefits since she is nursing.  Thus, advised we treat this symptomatically, discussed usual course of illness, discussed prevention, and discussed possible complications of the flu. Supportive care with appropriate antipyretics and fluids. Educational material distributed and questions answered. Follow up in prn  or as needed.

## 2011-08-09 NOTE — Patient Instructions (Signed)
Influenza, Adult Influenza ("the flu") is a viral infection of the respiratory tract. It causes chills, fever, cough, headache, body aches, and sore throat. Influenza in general will make you feel sicker than when you have a common cold. Symptoms of the illness typically last a few days. Cough and fatigue may continue for as long as 7 to 10 days. Influenza is highly contagious. It spreads easily to others in the droplets from coughs and sneezes. People frequently become infected by touching something that was recently contaminated with the virus and then touch their mouth, nose or eyes. This infection is caused by a virus. Symptoms will not be reduced or improved by taking an antibiotic. Antibiotics are medications that kill bacteria, not viruses. DIAGNOSIS  Diagnosis of influenza is often made based on the history and physical examination as well as the presence of influenza reports occurring in your community. Testing can be done if the diagnosis is not certain. TREATMENT  Since influenza is caused by a virus, antibiotics are not helpful. Your caregiver may prescribe antiviral medicines to shorten the illness and lessen the severity. Your caregiver may also recommend influenza vaccination and/or antiviral medicines for your family members in order to prevent the spread of influenza to them. HOME CARE INSTRUCTIONS  DO NOT GIVE ASPIRIN TO PERSONS WITH INFLUENZA WHO ARE UNDER 18 YEARS OF AGE. This could lead to brain and liver damage (Reye's syndrome). Read the label on over-the-counter medicines.   Stay home from work or school if at all possible until most of your symptoms are gone.   Only take over-the-counter or prescription medicines for pain, discomfort, or fever as directed by your caregiver.   Use a cool mist humidifier to increase air moisture. This will make breathing easier.   Rest until your temperature is nearly normal: 98.6 F (37 C). This usually takes 3 to 4 days. Be sure you get  plenty of rest.   Drink at least eight, eight-ounce glasses of fluids per day. Fluids include water, juice, broth, gelatin, or lemonade.   Cover your mouth and nose when coughing or sneezing and wash your hands often to prevent the spread of this virus to other persons.  PREVENTION  Annual influenza vaccination (flu shots) is the best way to avoid getting influenza. An annual flu shot is now routinely recommended for all adults in the [place] SEEK MEDICAL CARE IF:   You develop shortness of breath while resting.   You have a deep cough with production of mucous or chest pain.   You develop nausea (feeling sick to your stomach), vomiting, or diarrhea.  SEEK IMMEDIATE MEDICAL CARE IF:   You have difficulty breathing, become short of breath, or your skin or nails turn bluish.   You develop severe neck pain or stiffness.   You develop a severe headache, facial pain, or earache.   You have a fever.   You develop nausea or vomiting that cannot be controlled.  Document Released: 08/19/2000 Document Revised: 05/04/2011 Document Reviewed: 06/24/2009 [employer] Patient Information 2012 [employer]. 

## 2011-08-16 ENCOUNTER — Telehealth: Payer: Self-pay | Admitting: Family Medicine

## 2011-08-16 NOTE — Telephone Encounter (Signed)
If she is not allergic to and can take Amoxicillin, then call out (and order) Amoxicillin 875mg  1 tablet po BID x 10 days, #20, no refill

## 2011-08-16 NOTE — Telephone Encounter (Signed)
CALLED OUT AMOXCILLIN TO THE [facility] PER SHANE TYSINGER. CLS  DONE!

## 2011-09-27 ENCOUNTER — Encounter: Payer: Self-pay | Admitting: Medical

## 2011-09-27 ENCOUNTER — Ambulatory Visit (INDEPENDENT_AMBULATORY_CARE_PROVIDER_SITE_OTHER): Payer: 59 | Admitting: Medical

## 2011-09-27 VITALS — BP 110/80 | HR 72 | Temp 97.9°F | Resp 16 | Wt 171.0 lb

## 2011-09-27 DIAGNOSIS — L03019 Cellulitis of unspecified finger: Secondary | ICD-10-CM

## 2011-09-27 DIAGNOSIS — L03011 Cellulitis of right finger: Secondary | ICD-10-CM | POA: Insufficient documentation

## 2011-09-27 DIAGNOSIS — S6990XA Unspecified injury of unspecified wrist, hand and finger(s), initial encounter: Secondary | ICD-10-CM

## 2011-09-27 DIAGNOSIS — R05 Cough: Secondary | ICD-10-CM

## 2011-09-27 DIAGNOSIS — M79644 Pain in right finger(s): Secondary | ICD-10-CM

## 2011-09-27 DIAGNOSIS — R059 Cough, unspecified: Secondary | ICD-10-CM | POA: Insufficient documentation

## 2011-09-27 DIAGNOSIS — M79609 Pain in unspecified limb: Secondary | ICD-10-CM

## 2011-09-27 DIAGNOSIS — S6980XA Other specified injuries of unspecified wrist, hand and finger(s), initial encounter: Secondary | ICD-10-CM

## 2011-09-27 MED ORDER — AMOXICILLIN-POT CLAVULANATE 875-125 MG PO TABS: 1.0000 | ORAL_TABLET | Freq: Two times a day (BID) | ORAL | Status: AC

## 2011-09-27 MED ORDER — HYDROCODONE-ACETAMINOPHEN 5-325 MG PO TABS: 1.0000 | ORAL_TABLET | Freq: Four times a day (QID) | ORAL | Status: AC | PRN
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
## 2011-09-27 NOTE — Patient Instructions (Signed)
Go for xray tomorrow.

## 2011-09-28 ENCOUNTER — Ambulatory Visit
Admission: RE | Admit: 2011-09-28 | Discharge: 2011-09-28 | Disposition: A | Discharge status: Home / Self Care | Payer: 59 | Source: Ambulatory Visit | Attending: Medical | Admitting: Medical

## 2011-10-03 ENCOUNTER — Telehealth: Payer: Self-pay | Admitting: Family Medicine

## 2011-10-03 NOTE — Telephone Encounter (Signed)
Have her come in tomorrow

## 2011-10-04 ENCOUNTER — Encounter: Payer: Self-pay | Admitting: Medical

## 2011-10-04 ENCOUNTER — Ambulatory Visit (INDEPENDENT_AMBULATORY_CARE_PROVIDER_SITE_OTHER): Payer: 59 | Admitting: Medical

## 2011-10-04 VITALS — BP 110/70 | HR 72 | Temp 98.2°F | Resp 16 | Wt 172.0 lb

## 2011-10-04 DIAGNOSIS — IMO0002 Reserved for concepts with insufficient information to code with codable children: Secondary | ICD-10-CM | POA: Insufficient documentation

## 2011-10-04 MED ORDER — LEVOFLOXACIN 500 MG PO TABS: 500.0000 mg | ORAL_TABLET | Freq: Every day | ORAL | Status: DC

## 2011-10-04 NOTE — Telephone Encounter (Signed)
Lmom for the patient to call back so i can schedule her an appointment to look at her finger again. CLS

## 2011-10-04 NOTE — Progress Notes (Signed)
Subjective: I saw her a week ago for ongoing respiratory symptoms and paronychia.  We made small incision at that time but no drainage.  She has been using Augmentin a week now with no improvement.  She is breastfeeding. She has not been doing warm soaks, and is using the pain medication some.  ROS: Gen: no fever, chills, seats GI: no NVD  Objective: Gen.: Well-developed, well-nourished, no acute distress Skin: Right small finger distal phalanx with erythema, fluctuance of medial nailbed, quite tender, swollen, not a lot different looking than a week ago.   Assessment and plan: Encounter Diagnosis  Name Primary?  Kirke Shaggy Yes   Supervising physician Dr. Susann Givens examined and advised repeat I&D.  Pt consented, aware of risks/benefits.  Prepped and cleaned, used local anesthesia,  #11 blade used to incise, intermediate small amount of pus was expressed.  We changed to Levaquin today since not responding to Augmentin.  Advised she c/t warm soaks, pain medication prn, Ibuprofen prn alternatively, and call/return Friday for recheck.  Antibiotic choice was limited by her desire to continue breastfeeding.

## 2011-10-10 ENCOUNTER — Other Ambulatory Visit: Payer: Self-pay | Admitting: Medical

## 2011-10-10 MED ORDER — LEVOFLOXACIN 500 MG PO TABS: 500.0000 mg | ORAL_TABLET | Freq: Every day | ORAL | Status: AC

## 2012-07-27 ENCOUNTER — Ambulatory Visit: Payer: 59 | Admitting: Medical

## 2013-06-11 ENCOUNTER — Ambulatory Visit (INDEPENDENT_AMBULATORY_CARE_PROVIDER_SITE_OTHER): Payer: 59 | Admitting: Family Medicine

## 2013-06-11 ENCOUNTER — Encounter: Payer: Self-pay | Admitting: Family Medicine

## 2013-06-11 VITALS — BP 110/60 | HR 105 | Temp 98.5°F | Wt 182.0 lb

## 2013-06-11 DIAGNOSIS — J069 Acute upper respiratory infection, unspecified: Secondary | ICD-10-CM

## 2013-06-11 NOTE — Patient Instructions (Signed)
Treat your symptoms with decongestants and Tylenol and give it 7-10 days

## 2013-06-11 NOTE — Progress Notes (Signed)
  Subjective:    Patient ID: Pamela [name], female    DOB: April 13, 1976, 37 y.o.   MRN: [id number]  HPI She has a three-day history of sinus congestion, facial pressure, myalgias, sneezing and rhinorrhea, PND. She did have fever and chills on the first day but none since then. No sore throat or earache. She does not smoke and has no allergies. She is pregnant   Review of Systems     Objective:   Physical Exam alert and in no distress. Tympanic membranes and canals are normal. Throat is clear. Tonsils are normal. Neck is supple without adenopathy or thyromegaly. Cardiac exam shows a regular sinus rhythm without murmurs or gallops. Lungs are clear to auscultation.        Assessment & Plan:  URI, acute  recommend supportive care. If her symptoms continue past 7-10 days, she is to call me.

## 2013-08-14 ENCOUNTER — Encounter: Payer: Self-pay | Admitting: Obstetrics and Gynecology

## 2013-08-14 ENCOUNTER — Ambulatory Visit (INDEPENDENT_AMBULATORY_CARE_PROVIDER_SITE_OTHER): Payer: Medicaid Other | Admitting: Obstetrics and Gynecology

## 2013-08-14 VITALS — BP 123/67 | Temp 98.0°F | Wt 180.0 lb

## 2013-08-14 DIAGNOSIS — O09299 Supervision of pregnancy with other poor reproductive or obstetric history, unspecified trimester: Secondary | ICD-10-CM | POA: Insufficient documentation

## 2013-08-14 DIAGNOSIS — Z23 Encounter for immunization: Secondary | ICD-10-CM

## 2013-08-14 DIAGNOSIS — O09292 Supervision of pregnancy with other poor reproductive or obstetric history, second trimester: Secondary | ICD-10-CM | POA: Insufficient documentation

## 2013-08-14 DIAGNOSIS — O09529 Supervision of elderly multigravida, unspecified trimester: Secondary | ICD-10-CM | POA: Insufficient documentation

## 2013-08-14 LAB — POCT URINALYSIS DIP (DEVICE)
Glucose, UA: NEGATIVE mg/dL
Nitrite: NEGATIVE
Urobilinogen, UA: 1 mg/dL (ref 0.0–1.0)
pH: 7 (ref 5.0–8.0)

## 2013-08-14 NOTE — Progress Notes (Signed)
Subjective:    Pamela [name] is a [id number] [redacted]w[redacted]d being seen today for her first obstetrical visit.  Her obstetrical history is significant for advanced maternal age and A2 GDM x2. Patient does intend to breast feed. Pregnancy history fully reviewed.  Patient reports .Had abnormal Pap 01/2013 and plan was re-Pap 1 year. Had [place] confirming dating at [facility] but no PN labs or NOB visit. She is SW planning to get MSW.  Filed Vitals:   08/14/13 1401  BP: 123/67  Temp: 98 F (36.7 C)  Weight: 180 lb (81.647 kg)    HISTORY: OB History  Gravida Para Term Preterm AB SAB TAB Ectopic Multiple Living  4 2 2  1  1   2     # Outcome Date GA Lbr Len/2nd Weight Sex Delivery Anes PTL Lv  4 CUR           3 TRM 08/04/11 [redacted]w[redacted]d 06:02 / 00:48 7 lb 14.2 oz (3.578 kg) M SVD EPI  Y     Comments: No anomalies noted  2 TRM 11/03/07 110w0d 08:00 7 lb 8 oz (3.402 kg) M SVD EPI  Y  1 TAB              Past Medical History  Diagnosis Date  . Allergy     RHINITIS  . Thyroid disease     THYROID CYST  . Diabetes mellitus   . Abnormal Pap smear   . Postpartum care following vaginal delivery (08/04/11) 08/05/2011   Past Surgical History  Procedure Laterality Date  . Colposcopy  2004   Family History  Problem Relation Age of Onset  . Diabetes Father   . Heart disease Father   . Hypertension Father   . Asthma Sister   . Arthritis Paternal Grandmother   . Hypertension Paternal Grandmother   . Diabetes Paternal Grandmother   . Arthritis Paternal Grandfather   . Hypertension Paternal Grandfather   . Multiple sclerosis Mother   . Stroke    . Prostate cancer Father      Exam    Uterus:     Pelvic Exam:    Perineum: No Hemorrhoids   Vulva: normal, Bartholin's, Urethra, Skene's normal   Vagina:  normal mucosa, normal discharge       Cervix: multiparous appearance, no bleeding following Pap and no lesions   Adnexa: not evaluated   Bony Pelvis: average  System: Breast:  normal appearance,  no masses or tenderness   Skin: normal coloration and turgor, no rashes    Neurologic: oriented, normal, grossly non-focal   Extremities: normal strength, tone, and muscle mass   HEENT PERRLA, extra ocular movement intact and thyroid without masses   Mouth/Teeth mucous membranes moist, pharynx normal without lesions and dental hygiene good   Neck supple and no masses   Cardiovascular: regular rate and rhythm, no murmurs or gallops   Respiratory:  appears well, vitals normal, no respiratory distress, acyanotic, normal RR, ear and throat exam is normal, neck free of mass or lymphadenopathy, chest clear, no wheezing, crepitations, rhonchi, normal symmetric air entry   Abdomen: NT, S=D, DT 155   Urinary: urethral meatus normal      Assessment:    Pregnancy: [id number] Patient Active Problem List   Diagnosis Date Noted  . History of gestational diabetes in prior pregnancy, currently pregnant in second trimester 08/14/2013  AMA      Plan:     Initial labs drawn. 1 hr glucola, flu simmunization, quad  screen Prenatal vitamins. Problem list reviewed and updated. Genetic Screening discussed Quad Screen: ordered.  Ultrasound discussed; fetal survey: ordered.  Follow up in 4 weeks. 50% of 30 min visit spent on counseling and coordination of care.     Akeia Perot 08/14/2013

## 2013-08-14 NOTE — Patient Instructions (Signed)
Second Trimester of Pregnancy The second trimester is from week 13 through week 28, months 4 through 6. The second trimester is often a time when you feel your best. Your body has also adjusted to being pregnant, and you begin to feel better physically. Usually, morning sickness has lessened or quit completely, you may have more energy, and you may have an increase in appetite. The second trimester is also a time when the fetus is growing rapidly. At the end of the sixth month, the fetus is about 9 inches long and weighs about 1 pounds. You will likely begin to feel the baby move (quickening) between 18 and 20 weeks of the pregnancy. BODY CHANGES Your body goes through many changes during pregnancy. The changes vary from woman to woman.   Your weight will continue to increase. You will notice your lower abdomen bulging out.  You may begin to get stretch marks on your hips, abdomen, and breasts.  You may develop headaches that can be relieved by medicines approved by your caregiver.  You may urinate more often because the fetus is pressing on your bladder.  You may develop or continue to have heartburn as a result of your pregnancy.  You may develop constipation because certain hormones are causing the muscles that push waste through your intestines to slow down.  You may develop hemorrhoids or swollen, bulging veins (varicose veins).  You may have back pain because of the weight gain and pregnancy hormones relaxing your joints between the bones in your pelvis and as a result of a shift in weight and the muscles that support your balance.  Your breasts will continue to grow and be tender.  Your gums may bleed and may be sensitive to brushing and flossing.  Dark spots or blotches (chloasma, mask of pregnancy) may develop on your face. This will likely fade after the baby is born.  A dark line from your belly button to the pubic area (linea nigra) may appear. This will likely fade after the  baby is born. WHAT TO EXPECT AT YOUR PRENATAL VISITS During a routine prenatal visit:  You will be weighed to make sure you and the fetus are growing normally.  Your blood pressure will be taken.  Your abdomen will be measured to track your baby's growth.  The fetal heartbeat will be listened to.  Any test results from the previous visit will be discussed. Your caregiver may ask you:  How you are feeling.  If you are feeling the baby move.  If you have had any abnormal symptoms, such as leaking fluid, bleeding, severe headaches, or abdominal cramping.  If you have any questions. Other tests that may be performed during your second trimester include:  Blood tests that check for:  Low iron levels (anemia).  Gestational diabetes (between 24 and 28 weeks).  Rh antibodies.  Urine tests to check for infections, diabetes, or protein in the urine.  An ultrasound to confirm the proper growth and development of the baby.  An amniocentesis to check for possible genetic problems.  Fetal screens for spina bifida and Down syndrome. HOME CARE INSTRUCTIONS   Avoid all smoking, herbs, alcohol, and unprescribed drugs. These chemicals affect the formation and growth of the baby.  Follow your caregiver's instructions regarding medicine use. There are medicines that are either safe or unsafe to take during pregnancy.  Exercise only as directed by your caregiver. Experiencing uterine cramps is a good sign to stop exercising.  Continue to eat regular,   healthy meals.  Wear a good support bra for breast tenderness.  Do not use hot tubs, steam rooms, or saunas.  Wear your seat belt at all times when driving.  Avoid raw meat, uncooked cheese, cat litter boxes, and soil used by cats. These carry germs that can cause birth defects in the baby.  Take your prenatal vitamins.  Try taking a stool softener (if your caregiver approves) if you develop constipation. Eat more high-fiber foods,  such as fresh vegetables or fruit and whole grains. Drink plenty of fluids to keep your urine clear or pale yellow.  Take warm sitz baths to soothe any pain or discomfort caused by hemorrhoids. Use hemorrhoid cream if your caregiver approves.  If you develop varicose veins, wear support hose. Elevate your feet for 15 minutes, 3 4 times a day. Limit salt in your diet.  Avoid heavy lifting, wear low heel shoes, and practice good posture.  Rest with your legs elevated if you have leg cramps or low back pain.  Visit your dentist if you have not gone yet during your pregnancy. Use a soft toothbrush to brush your teeth and be gentle when you floss.  A sexual relationship may be continued unless your caregiver directs you otherwise.  Continue to go to all your prenatal visits as directed by your caregiver. SEEK MEDICAL CARE IF:   You have dizziness.  You have mild pelvic cramps, pelvic pressure, or nagging pain in the abdominal area.  You have persistent nausea, vomiting, or diarrhea.  You have a bad smelling vaginal discharge.  You have pain with urination. SEEK IMMEDIATE MEDICAL CARE IF:   You have a fever.  You are leaking fluid from your vagina.  You have spotting or bleeding from your vagina.  You have severe abdominal cramping or pain.  You have rapid weight gain or loss.  You have shortness of breath with chest pain.  You notice sudden or extreme swelling of your face, hands, ankles, feet, or legs.  You have not felt your baby move in over an hour.  You have severe headaches that do not go away with medicine.  You have vision changes. Document Released: 08/16/2001 Document Revised: 04/24/2013 Document Reviewed: 10/23/2012 [employer] Patient Information 2014 [employer].  

## 2013-08-14 NOTE — Progress Notes (Signed)
Pulse: 109 Patient has history of gestational diabetes with her previous pregnancys. Will have 1 hr gtt today.  Patient reports that she had a pap a [facility] and was normal. She is going to go get her records today and bring them back to [place].

## 2013-08-15 LAB — PRESCRIPTION MONITORING PROFILE (19 PANEL)
Buprenorphine, Urine: NEGATIVE ng/mL
Cannabinoid Scrn, Ur: NEGATIVE ng/mL
Carisoprodol, Urine: NEGATIVE ng/mL
Cocaine Metabolites: NEGATIVE ng/mL
Creatinine, Urine: 154.93 mg/dL (ref >20.0)
Fentanyl, Ur: NEGATIVE ng/mL
MDMA URINE: NEGATIVE ng/mL
Methadone Screen, Urine: NEGATIVE ng/mL
Methaqualone: NEGATIVE ng/mL
Opiate Screen, Urine: NEGATIVE ng/mL
Oxycodone Screen, Ur: NEGATIVE ng/mL
Phencyclidine, Ur: NEGATIVE ng/mL
Tapentadol, urine: NEGATIVE ng/mL

## 2013-08-15 LAB — OBSTETRIC PANEL
Basophils Absolute: 0 10*3/uL (ref 0.0–0.1)
Basophils Relative: 0 % (ref 0–1)
Eosinophils Absolute: 0 10*3/uL (ref 0.0–0.7)
Eosinophils Relative: 1 % (ref 0–5)
MCH: 27.8 pg (ref 26.0–34.0)
MCV: 83.8 fL (ref 78.0–100.0)
Platelets: 332 10*3/uL (ref 150–400)
RDW: 13.7 % (ref 11.5–15.5)

## 2013-08-15 LAB — AFP, QUAD SCREEN
AFP: 34 IU/mL
Age Alone: 1:249 {titer}
Down Syndrome Scr Risk Est: 1:1460 {titer}
HCG, Total: 25445 m[IU]/mL
INH: 89.3 pg/mL
MoM for AFP: 0.78
MoM for INH: 0.55
MoM for hCG: 1.71
Open Spina bifida: NEGATIVE
Osb Risk: <1:54600 {titer}
Tri 18 Scr Risk Est: NEGATIVE
Trisomy 18 (Edward) Syndrome Interp.: 1:12700 {titer}
uE3 Mom: 0.8

## 2013-08-15 LAB — HIV ANTIBODY (ROUTINE TESTING W REFLEX): HIV: NONREACTIVE

## 2013-08-16 LAB — CULTURE, OB URINE: Colony Count: 15000

## 2013-08-16 LAB — HEMOGLOBINOPATHY EVALUATION
Hgb A: 97.5 % (ref 96.8–97.8)
Hgb F Quant: 0 % (ref 0.0–2.0)
Hgb S Quant: 0 %

## 2013-08-23 ENCOUNTER — Other Ambulatory Visit: Payer: Self-pay | Admitting: Obstetrics and Gynecology

## 2013-08-23 ENCOUNTER — Ambulatory Visit (HOSPITAL_COMMUNITY)
Admission: RE | Admit: 2013-08-23 | Discharge: 2013-08-23 | Disposition: A | Discharge status: Home / Self Care | Payer: Medicaid Other | Source: Ambulatory Visit | Attending: Obstetrics and Gynecology | Admitting: Obstetrics and Gynecology

## 2013-08-23 DIAGNOSIS — Z363 Encounter for antenatal screening for malformations: Secondary | ICD-10-CM | POA: Insufficient documentation

## 2013-08-23 DIAGNOSIS — Z1389 Encounter for screening for other disorder: Secondary | ICD-10-CM | POA: Insufficient documentation

## 2013-08-23 DIAGNOSIS — O09292 Supervision of pregnancy with other poor reproductive or obstetric history, second trimester: Secondary | ICD-10-CM

## 2013-08-23 DIAGNOSIS — O09299 Supervision of pregnancy with other poor reproductive or obstetric history, unspecified trimester: Secondary | ICD-10-CM | POA: Insufficient documentation

## 2013-08-23 DIAGNOSIS — Z23 Encounter for immunization: Secondary | ICD-10-CM

## 2013-08-23 DIAGNOSIS — O358XX0 Maternal care for other (suspected) fetal abnormality and damage, not applicable or unspecified: Secondary | ICD-10-CM | POA: Insufficient documentation

## 2013-08-23 DIAGNOSIS — O09529 Supervision of elderly multigravida, unspecified trimester: Secondary | ICD-10-CM | POA: Insufficient documentation

## 2013-09-05 NOTE — L&D Delivery Note (Signed)
Delivery Note At 2:14 PM a viable female was delivered via  (Presentation: OA).  APGAR: 9, 9; weight: TBD.   Placenta status: Intact, Spontaneous.  Cord: 3 vessel w/o complications.  Anesthesia:  Epidural Episiotomy: none Lacerations: none Suture Repair: none Est. Blood Loss (mL): 250cc  Mom to postpartum.  Baby to Couplet care / Skin to Skin.  Significant progression towards complete dilation following SROM (on palpation only), urge to push, position change to R-lateral followed by L-lateral, then patient complete and pushing. Precipitous delivery of healthy baby boy, shoulder cord, delivered through with reduction on delivery. Delivery without difficulty, was noted to have good tone and place on maternal abdomen for oral suctioning, drying and stimulation. Delayed cord clamping performed and cut by family member. Placenta delivered intact with 3V cord. Vaginal canal and perineum was inspected and intact. Minimal uterine bleeding, good firm uterine tone. Counts sharps, instruments, and lap pads correct.  Saralyn PilarAlexander Karamalegos, DO [facility], PGY-1 01/02/2014, 2:36 PM  I was present for this delivery and agree with resident's note and plan of care.  Tawana ScaleMichael Ryan Myisha Pickerel, MD OB Fellow 01/02/2014 5:32 PM

## 2013-09-11 ENCOUNTER — Encounter: Payer: Self-pay | Admitting: *Deleted

## 2013-09-11 ENCOUNTER — Encounter: Payer: Self-pay | Admitting: Advanced Practice Midwife

## 2013-09-24 ENCOUNTER — Encounter: Payer: Self-pay | Admitting: Obstetrics & Gynecology

## 2013-09-24 ENCOUNTER — Ambulatory Visit (INDEPENDENT_AMBULATORY_CARE_PROVIDER_SITE_OTHER): Payer: Medicaid Other | Admitting: Advanced Practice Midwife

## 2013-09-24 ENCOUNTER — Encounter: Payer: Self-pay | Admitting: Advanced Practice Midwife

## 2013-09-24 VITALS — BP 111/69 | Temp 97.4°F | Wt 176.5 lb

## 2013-09-24 DIAGNOSIS — Z3490 Encounter for supervision of normal pregnancy, unspecified, unspecified trimester: Secondary | ICD-10-CM

## 2013-09-24 DIAGNOSIS — Z283 Underimmunization status: Secondary | ICD-10-CM

## 2013-09-24 DIAGNOSIS — Z2839 Other underimmunization status: Secondary | ICD-10-CM

## 2013-09-24 DIAGNOSIS — O09529 Supervision of elderly multigravida, unspecified trimester: Secondary | ICD-10-CM

## 2013-09-24 DIAGNOSIS — O9989 Other specified diseases and conditions complicating pregnancy, childbirth and the puerperium: Secondary | ICD-10-CM

## 2013-09-24 DIAGNOSIS — J069 Acute upper respiratory infection, unspecified: Secondary | ICD-10-CM

## 2013-09-24 DIAGNOSIS — O09899 Supervision of other high risk pregnancies, unspecified trimester: Secondary | ICD-10-CM

## 2013-09-24 LAB — POCT URINALYSIS DIP (DEVICE)
Glucose, UA: NEGATIVE mg/dL
Nitrite: NEGATIVE
PH: 6.5 (ref 5.0–8.0)
Protein, ur: 100 mg/dL — AB
Specific Gravity, Urine: 1.025 (ref 1.005–1.030)
Urobilinogen, UA: >=8 mg/dL (ref 0.0–1.0)

## 2013-09-24 MED ORDER — GUAIFENESIN ER 600 MG PO TB12: 600.0000 mg | ORAL_TABLET | Freq: Two times a day (BID) | ORAL | Status: DC

## 2013-09-24 MED ORDER — PSEUDOEPHEDRINE HCL 30 MG PO TABS: 30.0000 mg | ORAL_TABLET | Freq: Four times a day (QID) | ORAL | Status: DC | PRN

## 2013-09-24 NOTE — Progress Notes (Signed)
C/O URI symptoms x 3 days. Wants antibiotics. Son has been sick also. No fevers. Chest CTAB with no wheezes. HR RRR, slight tachy, no ectopy. + nasal congestion and cough. Recommend Mucinex, Sudafed (only when HR comes down), Tylenol PRN.  Cough drops as needed. If remains sick past Thursday, may order Azithromycin 500mg  x 1 then 250mg  QD x 4 day (Z-Pack).  Will schedule followup for anatomy scan

## 2013-09-24 NOTE — Patient Instructions (Signed)

## 2013-09-24 NOTE — Progress Notes (Signed)
P= 102 C/o of cough, sinus congestion, believes its a sinus infection-- has been taking Robitussin DM and tylenol with little relief.  C/o of mild intermittent lower abdominal/pelvic pressure.

## 2013-09-26 ENCOUNTER — Telehealth: Payer: Self-pay | Admitting: *Deleted

## 2013-09-26 MED ORDER — AZITHROMYCIN 250 MG PO TABS: ORAL_TABLET | ORAL | Status: DC

## 2013-09-26 NOTE — Telephone Encounter (Signed)
Pt called desiring antibiotic.  Note in chart from Tuesday gives medication/dosage.  Called pt and left message and instructed that her medication was ordered and to take as directed.

## 2013-10-01 ENCOUNTER — Ambulatory Visit (HOSPITAL_COMMUNITY): Payer: Medicaid Other

## 2013-10-11 ENCOUNTER — Ambulatory Visit (HOSPITAL_COMMUNITY)
Admission: RE | Admit: 2013-10-11 | Discharge: 2013-10-11 | Disposition: A | Discharge status: Home / Self Care | Payer: Medicaid Other | Source: Ambulatory Visit | Attending: Advanced Practice Midwife | Admitting: Advanced Practice Midwife

## 2013-10-11 DIAGNOSIS — O09529 Supervision of elderly multigravida, unspecified trimester: Secondary | ICD-10-CM | POA: Insufficient documentation

## 2013-10-11 DIAGNOSIS — O09299 Supervision of pregnancy with other poor reproductive or obstetric history, unspecified trimester: Secondary | ICD-10-CM | POA: Insufficient documentation

## 2013-10-11 DIAGNOSIS — Z3689 Encounter for other specified antenatal screening: Secondary | ICD-10-CM | POA: Insufficient documentation

## 2013-10-22 ENCOUNTER — Encounter: Payer: Medicaid Other | Admitting: Advanced Practice Midwife

## 2013-11-01 ENCOUNTER — Encounter: Payer: Medicaid Other | Admitting: Obstetrics and Gynecology

## 2013-11-25 ENCOUNTER — Ambulatory Visit (INDEPENDENT_AMBULATORY_CARE_PROVIDER_SITE_OTHER): Payer: Medicaid Other | Admitting: Obstetrics & Gynecology

## 2013-11-25 ENCOUNTER — Encounter: Payer: Self-pay | Admitting: Obstetrics & Gynecology

## 2013-11-25 ENCOUNTER — Encounter: Payer: Self-pay | Admitting: *Deleted

## 2013-11-25 VITALS — BP 115/56 | Temp 97.3°F | Wt 183.9 lb

## 2013-11-25 DIAGNOSIS — O09529 Supervision of elderly multigravida, unspecified trimester: Secondary | ICD-10-CM

## 2013-11-25 DIAGNOSIS — O09299 Supervision of pregnancy with other poor reproductive or obstetric history, unspecified trimester: Secondary | ICD-10-CM

## 2013-11-25 DIAGNOSIS — O09292 Supervision of pregnancy with other poor reproductive or obstetric history, second trimester: Secondary | ICD-10-CM

## 2013-11-25 DIAGNOSIS — Z23 Encounter for immunization: Secondary | ICD-10-CM

## 2013-11-25 DIAGNOSIS — Z8632 Personal history of gestational diabetes: Secondary | ICD-10-CM

## 2013-11-25 LAB — POCT URINALYSIS DIP (DEVICE)
BILIRUBIN URINE: NEGATIVE
Glucose, UA: NEGATIVE mg/dL
HGB URINE DIPSTICK: NEGATIVE
Ketones, ur: NEGATIVE mg/dL
Nitrite: NEGATIVE
PH: 7 (ref 5.0–8.0)
Protein, ur: NEGATIVE mg/dL
Specific Gravity, Urine: 1.02 (ref 1.005–1.030)
Urobilinogen, UA: 0.2 mg/dL (ref 0.0–1.0)

## 2013-11-25 MED ORDER — TETANUS-DIPHTH-ACELL PERTUSSIS 5-2.5-18.5 LF-MCG/0.5 IM SUSP: 0.5000 mL | Freq: Once | INTRAMUSCULAR | Status: DC

## 2013-11-25 NOTE — Patient Instructions (Signed)
Third Trimester of Pregnancy  The third trimester is from week 29 through week 42, months 7 through 9. The third trimester is a time when the fetus is growing rapidly. At the end of the ninth month, the fetus is about 20 inches in length and weighs 6 10 pounds.   BODY CHANGES  Your body goes through many changes during pregnancy. The changes vary from woman to woman.    Your weight will continue to increase. You can expect to gain 25 35 pounds (11 16 kg) by the end of the pregnancy.   You may begin to get stretch marks on your hips, abdomen, and breasts.   You may urinate more often because the fetus is moving lower into your pelvis and pressing on your bladder.   You may develop or continue to have heartburn as a result of your pregnancy.   You may develop constipation because certain hormones are causing the muscles that push waste through your intestines to slow down.   You may develop hemorrhoids or swollen, bulging veins (varicose veins).   You may have pelvic pain because of the weight gain and pregnancy hormones relaxing your joints between the bones in your pelvis. Back aches may result from over exertion of the muscles supporting your posture.   Your breasts will continue to grow and be tender. A yellow discharge may leak from your breasts called colostrum.   Your belly button may stick out.   You may feel short of breath because of your expanding uterus.   You may notice the fetus "dropping," or moving lower in your abdomen.   You may have a bloody mucus discharge. This usually occurs a few days to a week before labor begins.   Your cervix becomes thin and soft (effaced) near your due date.  WHAT TO EXPECT AT YOUR PRENATAL EXAMS   You will have prenatal exams every 2 weeks until week 36. Then, you will have weekly prenatal exams. During a routine prenatal visit:   You will be weighed to make sure you and the fetus are growing normally.   Your blood pressure is taken.   Your abdomen will be  measured to track your baby's growth.   The fetal heartbeat will be listened to.   Any test results from the previous visit will be discussed.   You may have a cervical check near your due date to see if you have effaced.  At around 36 weeks, your caregiver will check your cervix. At the same time, your caregiver will also perform a test on the secretions of the vaginal tissue. This test is to determine if a type of bacteria, Group B streptococcus, is present. Your caregiver will explain this further.  Your caregiver may ask you:   What your birth plan is.   How you are feeling.   If you are feeling the baby move.   If you have had any abnormal symptoms, such as leaking fluid, bleeding, severe headaches, or abdominal cramping.   If you have any questions.  Other tests or screenings that may be performed during your third trimester include:   Blood tests that check for low iron levels (anemia).   Fetal testing to check the health, activity level, and growth of the fetus. Testing is done if you have certain medical conditions or if there are problems during the pregnancy.  FALSE LABOR  You may feel small, irregular contractions that eventually go away. These are called Braxton Hicks contractions, or   false labor. Contractions may last for hours, days, or even weeks before true labor sets in. If contractions come at regular intervals, intensify, or become painful, it is best to be seen by your caregiver.   SIGNS OF LABOR    Menstrual-like cramps.   Contractions that are 5 minutes apart or less.   Contractions that start on the top of the uterus and spread down to the lower abdomen and back.   A sense of increased pelvic pressure or back pain.   A watery or bloody mucus discharge that comes from the vagina.  If you have any of these signs before the 37th week of pregnancy, call your caregiver right away. You need to go to the hospital to get checked immediately.  HOME CARE INSTRUCTIONS    Avoid all  smoking, herbs, alcohol, and unprescribed drugs. These chemicals affect the formation and growth of the baby.   Follow your caregiver's instructions regarding medicine use. There are medicines that are either safe or unsafe to take during pregnancy.   Exercise only as directed by your caregiver. Experiencing uterine cramps is a good sign to stop exercising.   Continue to eat regular, healthy meals.   Wear a good support bra for breast tenderness.   Do not use hot tubs, steam rooms, or saunas.   Wear your seat belt at all times when driving.   Avoid raw meat, uncooked cheese, cat litter boxes, and soil used by cats. These carry germs that can cause birth defects in the baby.   Take your prenatal vitamins.   Try taking a stool softener (if your caregiver approves) if you develop constipation. Eat more high-fiber foods, such as fresh vegetables or fruit and whole grains. Drink plenty of fluids to keep your urine clear or pale yellow.   Take warm sitz baths to soothe any pain or discomfort caused by hemorrhoids. Use hemorrhoid cream if your caregiver approves.   If you develop varicose veins, wear support hose. Elevate your feet for 15 minutes, 3 4 times a day. Limit salt in your diet.   Avoid heavy lifting, wear low heal shoes, and practice good posture.   Rest a lot with your legs elevated if you have leg cramps or low back pain.   Visit your dentist if you have not gone during your pregnancy. Use a soft toothbrush to brush your teeth and be gentle when you floss.   A sexual relationship may be continued unless your caregiver directs you otherwise.   Do not travel far distances unless it is absolutely necessary and only with the approval of your caregiver.   Take prenatal classes to understand, practice, and ask questions about the labor and delivery.   Make a trial run to the hospital.   Pack your hospital bag.   Prepare the baby's nursery.   Continue to go to all your prenatal visits as directed  by your caregiver.  SEEK MEDICAL CARE IF:   You are unsure if you are in labor or if your water has broken.   You have dizziness.   You have mild pelvic cramps, pelvic pressure, or nagging pain in your abdominal area.   You have persistent nausea, vomiting, or diarrhea.   You have a bad smelling vaginal discharge.   You have pain with urination.  SEEK IMMEDIATE MEDICAL CARE IF:    You have a fever.   You are leaking fluid from your vagina.   You have spotting or bleeding from your vagina.     You have severe abdominal cramping or pain.   You have rapid weight loss or gain.   You have shortness of breath with chest pain.   You notice sudden or extreme swelling of your face, hands, ankles, feet, or legs.   You have not felt your baby move in over an hour.   You have severe headaches that do not go away with medicine.   You have vision changes.  Document Released: 08/16/2001 Document Revised: 04/24/2013 Document Reviewed: 10/23/2012  [employer] Patient Information 2014 [employer]

## 2013-11-25 NOTE — Progress Notes (Signed)
P=100 C/o of intermittent lower abdominal/pelvic pressure and braxton hicks.  Edema in feet.  Pt. Missed appointments; missed 28 week labs. 1hr gtt, HIV, CBC, RPR today.

## 2013-11-26 ENCOUNTER — Telehealth: Payer: Self-pay | Admitting: *Deleted

## 2013-11-26 LAB — RPR

## 2013-11-26 LAB — CBC
HEMATOCRIT: 30.5 % — AB (ref 36.0–46.0)
Hemoglobin: 10 g/dL — ABNORMAL LOW (ref 12.0–15.0)
MCH: 26.8 pg (ref 26.0–34.0)
MCHC: 32.8 g/dL (ref 30.0–36.0)
MCV: 81.8 fL (ref 78.0–100.0)
Platelets: 364 10*3/uL (ref 150–400)
RBC: 3.73 MIL/uL — AB (ref 3.87–5.11)
RDW: 15 % (ref 11.5–15.5)
WBC: 7.3 10*3/uL (ref 4.0–10.5)

## 2013-11-26 LAB — HIV ANTIBODY (ROUTINE TESTING W REFLEX): HIV: NONREACTIVE

## 2013-11-26 LAB — GLUCOSE TOLERANCE, 1 HOUR (50G) W/O FASTING: Glucose, 1 Hour GTT: 157 mg/dL — ABNORMAL HIGH (ref 70–140)

## 2013-11-26 NOTE — Telephone Encounter (Signed)
Called patient and gave results, scheduled lab for 3 hour glucose testing on Friday 11/29/2013 @ 0800.  Instructions given patient verbalizes understanding.

## 2013-11-27 ENCOUNTER — Telehealth: Payer: Self-pay

## 2013-11-27 NOTE — Telephone Encounter (Signed)
Pt. Called and stated she is having lab work done Friday a 3hr gtt and would like to know if she can have an appointment for a pap smear on that day. Called pt. And informed her that she had a pap smear on 08/08/13 and there is no need for another one this year. Pt. Verbalized understanding. Pt. Expressed concern for not being physically checked at her last appointment and states she has and a small amount of clear/whitish discharge and would like to know if she could be checked Friday. Pt. Denied itching, irritation, odor-- s/s of infection. Explained to pt. That the discharge she is experiencing is normal and that she should not be concerned; stated at her next appointment they will do an internal exam and test for GC/ch and GBS. Pt. Verbalized understanding and stated she would feel better if they had just checked her. Explained to pt. That we do not have any open appointments Friday at this time and that because her symptoms sound normal at this point in time it will be appropriate to be checked at her next visit but explained to pt. That if any other symptoms, questions or concerns arise to please express them at appointment on Friday or call clinic. Pt. Verbalized understanding and gratitude and had no further questions.

## 2013-11-29 ENCOUNTER — Other Ambulatory Visit: Payer: Medicaid Other

## 2013-11-29 DIAGNOSIS — O09292 Supervision of pregnancy with other poor reproductive or obstetric history, second trimester: Secondary | ICD-10-CM

## 2013-11-29 DIAGNOSIS — Z8632 Personal history of gestational diabetes: Principal | ICD-10-CM

## 2013-11-29 DIAGNOSIS — O09299 Supervision of pregnancy with other poor reproductive or obstetric history, unspecified trimester: Secondary | ICD-10-CM

## 2013-11-29 LAB — GLUCOSE TOLERANCE, 3 HOURS
GLUCOSE, 2 HOUR-GESTATIONAL: 168 mg/dL — AB (ref 70–164)
Glucose Tolerance, 1 hour: 154 mg/dL (ref 70–189)
Glucose Tolerance, Fasting: 97 mg/dL (ref 70–104)
Glucose, GTT - 3 Hour: 125 mg/dL (ref 70–144)

## 2013-12-05 ENCOUNTER — Telehealth: Payer: Self-pay | Admitting: General Practice

## 2013-12-05 NOTE — Telephone Encounter (Signed)
Patient called and left message requested results of 3 hr glucose test she had done last week. Called patient and informed her of normal results. Patient verbalized understanding and had no further questions

## 2013-12-11 ENCOUNTER — Telehealth: Payer: Self-pay

## 2013-12-11 ENCOUNTER — Ambulatory Visit (INDEPENDENT_AMBULATORY_CARE_PROVIDER_SITE_OTHER): Payer: Medicaid Other | Admitting: Obstetrics and Gynecology

## 2013-12-11 VITALS — BP 99/70 | Temp 97.0°F | Wt 188.2 lb

## 2013-12-11 DIAGNOSIS — O9989 Other specified diseases and conditions complicating pregnancy, childbirth and the puerperium: Secondary | ICD-10-CM

## 2013-12-11 DIAGNOSIS — O09292 Supervision of pregnancy with other poor reproductive or obstetric history, second trimester: Secondary | ICD-10-CM

## 2013-12-11 DIAGNOSIS — Z22330 Carrier of Group B streptococcus: Secondary | ICD-10-CM

## 2013-12-11 DIAGNOSIS — O99820 Streptococcus B carrier state complicating pregnancy: Secondary | ICD-10-CM

## 2013-12-11 DIAGNOSIS — Z2839 Other underimmunization status: Secondary | ICD-10-CM

## 2013-12-11 DIAGNOSIS — O99810 Abnormal glucose complicating pregnancy: Secondary | ICD-10-CM

## 2013-12-11 DIAGNOSIS — O09299 Supervision of pregnancy with other poor reproductive or obstetric history, unspecified trimester: Secondary | ICD-10-CM

## 2013-12-11 DIAGNOSIS — O24419 Gestational diabetes mellitus in pregnancy, unspecified control: Secondary | ICD-10-CM | POA: Insufficient documentation

## 2013-12-11 DIAGNOSIS — Z283 Underimmunization status: Secondary | ICD-10-CM

## 2013-12-11 DIAGNOSIS — Z8632 Personal history of gestational diabetes: Secondary | ICD-10-CM

## 2013-12-11 DIAGNOSIS — O09899 Supervision of other high risk pregnancies, unspecified trimester: Secondary | ICD-10-CM

## 2013-12-11 LAB — POCT URINALYSIS DIP (DEVICE)
BILIRUBIN URINE: NEGATIVE
Glucose, UA: NEGATIVE mg/dL
Hgb urine dipstick: NEGATIVE
KETONES UR: NEGATIVE mg/dL
NITRITE: NEGATIVE
Protein, ur: NEGATIVE mg/dL
Specific Gravity, Urine: 1.025 (ref 1.005–1.030)
Urobilinogen, UA: 0.2 mg/dL (ref 0.0–1.0)
pH: 7 (ref 5.0–8.0)

## 2013-12-11 LAB — OB RESULTS CONSOLE GC/CHLAMYDIA
Chlamydia: NEGATIVE
Gonorrhea: NEGATIVE

## 2013-12-11 LAB — OB RESULTS CONSOLE GBS: STREP GROUP B AG: POSITIVE

## 2013-12-11 MED ORDER — GLUCOSE BLOOD VI STRP: ORAL_STRIP | Status: DC

## 2013-12-11 MED ORDER — ACCU-CHEK FASTCLIX LANCETS MISC: 1.0000 | Freq: Four times a day (QID) | Status: DC

## 2013-12-11 NOTE — Patient Instructions (Signed)
Third Trimester of Pregnancy  The third trimester is from week 29 through week 42, months 7 through 9. The third trimester is a time when the fetus is growing rapidly. At the end of the ninth month, the fetus is about 20 inches in length and weighs 6 10 pounds.   BODY CHANGES  Your body goes through many changes during pregnancy. The changes vary from woman to woman.    Your weight will continue to increase. You can expect to gain 25 35 pounds (11 16 kg) by the end of the pregnancy.   You may begin to get stretch marks on your hips, abdomen, and breasts.   You may urinate more often because the fetus is moving lower into your pelvis and pressing on your bladder.   You may develop or continue to have heartburn as a result of your pregnancy.   You may develop constipation because certain hormones are causing the muscles that push waste through your intestines to slow down.   You may develop hemorrhoids or swollen, bulging veins (varicose veins).   You may have pelvic pain because of the weight gain and pregnancy hormones relaxing your joints between the bones in your pelvis. Back aches may result from over exertion of the muscles supporting your posture.   Your breasts will continue to grow and be tender. A yellow discharge may leak from your breasts called colostrum.   Your belly button may stick out.   You may feel short of breath because of your expanding uterus.   You may notice the fetus "dropping," or moving lower in your abdomen.   You may have a bloody mucus discharge. This usually occurs a few days to a week before labor begins.   Your cervix becomes thin and soft (effaced) near your due date.  WHAT TO EXPECT AT YOUR PRENATAL EXAMS   You will have prenatal exams every 2 weeks until week 36. Then, you will have weekly prenatal exams. During a routine prenatal visit:   You will be weighed to make sure you and the fetus are growing normally.   Your blood pressure is taken.   Your abdomen will be  measured to track your baby's growth.   The fetal heartbeat will be listened to.   Any test results from the previous visit will be discussed.   You may have a cervical check near your due date to see if you have effaced.  At around 36 weeks, your caregiver will check your cervix. At the same time, your caregiver will also perform a test on the secretions of the vaginal tissue. This test is to determine if a type of bacteria, Group B streptococcus, is present. Your caregiver will explain this further.  Your caregiver may ask you:   What your birth plan is.   How you are feeling.   If you are feeling the baby move.   If you have had any abnormal symptoms, such as leaking fluid, bleeding, severe headaches, or abdominal cramping.   If you have any questions.  Other tests or screenings that may be performed during your third trimester include:   Blood tests that check for low iron levels (anemia).   Fetal testing to check the health, activity level, and growth of the fetus. Testing is done if you have certain medical conditions or if there are problems during the pregnancy.  FALSE LABOR  You may feel small, irregular contractions that eventually go away. These are called Braxton Hicks contractions, or   false labor. Contractions may last for hours, days, or even weeks before true labor sets in. If contractions come at regular intervals, intensify, or become painful, it is best to be seen by your caregiver.   SIGNS OF LABOR    Menstrual-like cramps.   Contractions that are 5 minutes apart or less.   Contractions that start on the top of the uterus and spread down to the lower abdomen and back.   A sense of increased pelvic pressure or back pain.   A watery or bloody mucus discharge that comes from the vagina.  If you have any of these signs before the 37th week of pregnancy, call your caregiver right away. You need to go to the hospital to get checked immediately.  HOME CARE INSTRUCTIONS    Avoid all  smoking, herbs, alcohol, and unprescribed drugs. These chemicals affect the formation and growth of the baby.   Follow your caregiver's instructions regarding medicine use. There are medicines that are either safe or unsafe to take during pregnancy.   Exercise only as directed by your caregiver. Experiencing uterine cramps is a good sign to stop exercising.   Continue to eat regular, healthy meals.   Wear a good support bra for breast tenderness.   Do not use hot tubs, steam rooms, or saunas.   Wear your seat belt at all times when driving.   Avoid raw meat, uncooked cheese, cat litter boxes, and soil used by cats. These carry germs that can cause birth defects in the baby.   Take your prenatal vitamins.   Try taking a stool softener (if your caregiver approves) if you develop constipation. Eat more high-fiber foods, such as fresh vegetables or fruit and whole grains. Drink plenty of fluids to keep your urine clear or pale yellow.   Take warm sitz baths to soothe any pain or discomfort caused by hemorrhoids. Use hemorrhoid cream if your caregiver approves.   If you develop varicose veins, wear support hose. Elevate your feet for 15 minutes, 3 4 times a day. Limit salt in your diet.   Avoid heavy lifting, wear low heal shoes, and practice good posture.   Rest a lot with your legs elevated if you have leg cramps or low back pain.   Visit your dentist if you have not gone during your pregnancy. Use a soft toothbrush to brush your teeth and be gentle when you floss.   A sexual relationship may be continued unless your caregiver directs you otherwise.   Do not travel far distances unless it is absolutely necessary and only with the approval of your caregiver.   Take prenatal classes to understand, practice, and ask questions about the labor and delivery.   Make a trial run to the hospital.   Pack your hospital bag.   Prepare the baby's nursery.   Continue to go to all your prenatal visits as directed  by your caregiver.  SEEK MEDICAL CARE IF:   You are unsure if you are in labor or if your water has broken.   You have dizziness.   You have mild pelvic cramps, pelvic pressure, or nagging pain in your abdominal area.   You have persistent nausea, vomiting, or diarrhea.   You have a bad smelling vaginal discharge.   You have pain with urination.  SEEK IMMEDIATE MEDICAL CARE IF:    You have a fever.   You are leaking fluid from your vagina.   You have spotting or bleeding from your vagina.     You have severe abdominal cramping or pain.   You have rapid weight loss or gain.   You have shortness of breath with chest pain.   You notice sudden or extreme swelling of your face, hands, ankles, feet, or legs.   You have not felt your baby move in over an hour.   You have severe headaches that do not go away with medicine.   You have vision changes.  Document Released: 08/16/2001 Document Revised: 04/24/2013 Document Reviewed: 10/23/2012  [employer] Patient Information 2014 [employer]

## 2013-12-11 NOTE — Progress Notes (Signed)
Pulse-  110 Patient reports pelvic pain/pressure; also reports irregular contractions

## 2013-12-11 NOTE — Telephone Encounter (Signed)
F/u ultrasound scheduled for this Friday 4/10 at 1615. Called pt. And informed her of appointment. Also informed her that glucose meter and test strips have been sent to her pharmacy. Pt. Verbalized understanding and state she would pick them up later today. No questions or concerns.

## 2013-12-11 NOTE — Progress Notes (Signed)
Had (late) abnl 1 hr OGTT at last [facility] 11/25/13, then had abnl 3 hr OGTT 11/29/13. Pt unaware of result. Hx A2 GDM x2. Advised limit carbs, avoid any simple sugars. She believes she has her meter from last pregnancy. Will Rx meter, strips and lancet; advised to check FBS, 2 hr PP all meals and return Monday (in 5 days). Will schedule US.  GC/CT, GBS sent.

## 2013-12-13 ENCOUNTER — Ambulatory Visit (HOSPITAL_COMMUNITY)
Admission: RE | Admit: 2013-12-13 | Discharge: 2013-12-13 | Disposition: A | Discharge status: Home / Self Care | Payer: Medicaid Other | Source: Ambulatory Visit | Attending: Obstetrics and Gynecology | Admitting: Obstetrics and Gynecology

## 2013-12-13 DIAGNOSIS — Z3689 Encounter for other specified antenatal screening: Secondary | ICD-10-CM | POA: Insufficient documentation

## 2013-12-13 DIAGNOSIS — O99820 Streptococcus B carrier state complicating pregnancy: Secondary | ICD-10-CM | POA: Insufficient documentation

## 2013-12-13 DIAGNOSIS — Z8632 Personal history of gestational diabetes: Secondary | ICD-10-CM

## 2013-12-13 DIAGNOSIS — O09529 Supervision of elderly multigravida, unspecified trimester: Secondary | ICD-10-CM | POA: Insufficient documentation

## 2013-12-13 DIAGNOSIS — O99810 Abnormal glucose complicating pregnancy: Secondary | ICD-10-CM | POA: Insufficient documentation

## 2013-12-13 DIAGNOSIS — O24419 Gestational diabetes mellitus in pregnancy, unspecified control: Secondary | ICD-10-CM

## 2013-12-13 DIAGNOSIS — O09292 Supervision of pregnancy with other poor reproductive or obstetric history, second trimester: Secondary | ICD-10-CM

## 2013-12-13 DIAGNOSIS — O09299 Supervision of pregnancy with other poor reproductive or obstetric history, unspecified trimester: Secondary | ICD-10-CM | POA: Insufficient documentation

## 2013-12-13 LAB — GC/CHLAMYDIA PROBE AMP
CT PROBE, AMP APTIMA: NEGATIVE
GC Probe RNA: NEGATIVE

## 2013-12-13 LAB — CULTURE, BETA STREP (GROUP B ONLY)

## 2013-12-16 ENCOUNTER — Encounter: Payer: Self-pay | Admitting: Family Medicine

## 2013-12-16 ENCOUNTER — Ambulatory Visit (INDEPENDENT_AMBULATORY_CARE_PROVIDER_SITE_OTHER): Payer: Medicaid Other | Admitting: Family Medicine

## 2013-12-16 ENCOUNTER — Encounter: Payer: Medicaid Other | Admitting: Family Medicine

## 2013-12-16 VITALS — BP 114/61 | Temp 97.2°F | Wt 186.8 lb

## 2013-12-16 DIAGNOSIS — Z283 Underimmunization status: Secondary | ICD-10-CM

## 2013-12-16 DIAGNOSIS — O9989 Other specified diseases and conditions complicating pregnancy, childbirth and the puerperium: Secondary | ICD-10-CM

## 2013-12-16 DIAGNOSIS — Z22330 Carrier of Group B streptococcus: Secondary | ICD-10-CM

## 2013-12-16 DIAGNOSIS — O99820 Streptococcus B carrier state complicating pregnancy: Secondary | ICD-10-CM

## 2013-12-16 DIAGNOSIS — O24419 Gestational diabetes mellitus in pregnancy, unspecified control: Secondary | ICD-10-CM

## 2013-12-16 DIAGNOSIS — O09292 Supervision of pregnancy with other poor reproductive or obstetric history, second trimester: Secondary | ICD-10-CM

## 2013-12-16 DIAGNOSIS — Z2839 Other underimmunization status: Secondary | ICD-10-CM

## 2013-12-16 DIAGNOSIS — O99810 Abnormal glucose complicating pregnancy: Secondary | ICD-10-CM

## 2013-12-16 DIAGNOSIS — O09899 Supervision of other high risk pregnancies, unspecified trimester: Secondary | ICD-10-CM

## 2013-12-16 DIAGNOSIS — Z8632 Personal history of gestational diabetes: Secondary | ICD-10-CM

## 2013-12-16 DIAGNOSIS — O09529 Supervision of elderly multigravida, unspecified trimester: Secondary | ICD-10-CM

## 2013-12-16 DIAGNOSIS — O09299 Supervision of pregnancy with other poor reproductive or obstetric history, unspecified trimester: Secondary | ICD-10-CM

## 2013-12-16 LAB — POCT URINALYSIS DIP (DEVICE)
BILIRUBIN URINE: NEGATIVE
Glucose, UA: NEGATIVE mg/dL
Ketones, ur: NEGATIVE mg/dL
NITRITE: NEGATIVE
PH: 7 (ref 5.0–8.0)
Protein, ur: 30 mg/dL — AB
SPECIFIC GRAVITY, URINE: 1.025 (ref 1.005–1.030)
Urobilinogen, UA: 1 mg/dL (ref 0.0–1.0)

## 2013-12-16 MED ORDER — ACCU-CHEK NANO SMARTVIEW W/DEVICE KIT: 1.0000 [IU] | PACK | Status: DC

## 2013-12-16 NOTE — Progress Notes (Signed)
No BS today--no meter--given--Induction would happen at 40 if needs no meds--will need BS to assess. U/s on 4/10 showed 6 lb 6 oz (66%), vtx, nml fluid--appropriate growth. GBS positive--needs treatment in labor

## 2013-12-16 NOTE — Progress Notes (Signed)
P=104 C/o of intermittent lower abdominal/pelvic pressure and braxton hicks.

## 2013-12-16 NOTE — Patient Instructions (Signed)
Gestational Diabetes Mellitus Gestational diabetes mellitus, often simply referred to as gestational diabetes, is a type of diabetes that some women develop during pregnancy. In gestational diabetes, the pancreas does not make enough insulin (a hormone), the cells are less responsive to the insulin that is made (insulin resistance), or both.Normally, insulin moves sugars from food into the tissue cells. The tissue cells use the sugars for energy. The lack of insulin or the lack of normal response to insulin causes excess sugars to build up in the blood instead of going into the tissue cells. As a result, high blood sugar (hyperglycemia) develops. The effect of high sugar (glucose) levels can cause many complications.  RISK FACTORS You have an increased chance of developing gestational diabetes if you have a family history of diabetes and also have one or more of the following risk factors:  A body mass index over 30 (obesity).  A previous pregnancy with gestational diabetes.  An older age at the time of pregnancy. If blood glucose levels are kept in the normal range during pregnancy, women can have a healthy pregnancy. If your blood glucose levels are not well controlled, there may be risks to you, your unborn baby (fetus), your labor and delivery, or your newborn baby.  SYMPTOMS  If symptoms are experienced, they are much like symptoms you would normally expect during pregnancy. The symptoms of gestational diabetes include:   Increased thirst (polydipsia).  Increased urination (polyuria).  Increased urination during the night (nocturia).  Weight loss. This weight loss may be rapid.  Frequent, recurring infections.  Tiredness (fatigue).  Weakness.  Vision changes, such as blurred vision.  Fruity smell to your breath.  Abdominal pain. DIAGNOSIS Diabetes is diagnosed when blood glucose levels are increased. Your blood glucose level may be checked by one or more of the following  blood tests:  A fasting blood glucose test. You will not be allowed to eat for at least 8 hours before a blood sample is taken.  A random blood glucose test. Your blood glucose is checked at any time of the day regardless of when you ate.  A hemoglobin A1c blood glucose test. A hemoglobin A1c test provides information about blood glucose control over the previous 3 months.  An oral glucose tolerance test (OGTT). Your blood glucose is measured after you have not eaten (fasted) for 1 3 hours and then after you drink a glucose-containing beverage. Since the hormones that cause insulin resistance are highest at about 24 28 weeks of a pregnancy, an OGTT is usually performed during that time. If you have risk factors for gestational diabetes, your caregiver may test you for gestational diabetes earlier than 24 weeks of pregnancy. TREATMENT   You will need to take diabetes medicine or insulin daily to keep blood glucose levels in the desired range.  You will need to match insulin dosing with exercise and healthy food choices. The treatment goal is to maintain the before meal (preprandial), bedtime, and overnight blood glucose level at 60 99 mg/dL during pregnancy. The treatment goal is to further maintain peak after meal blood sugar (postprandial glucose) level at 100 140 mg/dL. HOME CARE INSTRUCTIONS   Have your hemoglobin A1c level checked twice a year.  Perform daily blood glucose monitoring as directed by your caregiver. It is common to perform frequent blood glucose monitoring.  Monitor urine ketones when you are ill and as directed by your caregiver.  Take your diabetes medicine and insulin as directed by your caregiver to maintain   your blood glucose level in the desired range.  Never run out of diabetes medicine or insulin. It is needed every day.  Adjust insulin based on your intake of carbohydrates. Carbohydrates can raise blood glucose levels but need to be included in your diet.  Carbohydrates provide vitamins, minerals, and fiber which are an essential part of a healthy diet. Carbohydrates are found in fruits, vegetables, whole grains, dairy products, legumes, and foods containing added sugars.    Eat healthy foods. Alternate 3 meals with 3 snacks.  Maintain a healthy weight gain. The usual total expected weight gain varies according to your prepregnancy body mass index (BMI).  Carry a medical alert card or wear your medical alert jewelry.  Carry a 15 gram carbohydrate snack with you at all times to treat low blood glucose (hypoglycemia). Some examples of 15 gram carbohydrate snacks include:  Glucose tablets, 3 or 4   Glucose gel, 15 gram tube  Raisins, 2 tablespoons (24 g)  Jelly beans, 6  Animal crackers, 8  Fruit juice, regular soda, or low fat milk, 4 ounces (120 mL)  Gummy treats, 9    Recognize hypoglycemia. Hypoglycemia during pregnancy occurs with blood glucose levels of 60 mg/dL and below. The risk for hypoglycemia increases when fasting or skipping meals, during or after intense exercise, and during sleep. Hypoglycemia symptoms can include:  Tremors or shakes.  Decreased ability to concentrate.  Sweating.  Increased heart rate.  Headache.  Dry mouth.  Hunger.  Irritability.  Anxiety.  Restless sleep.  Altered speech or coordination.  Confusion.  Treat hypoglycemia promptly. If you are alert and able to safely swallow, follow the 15:15 rule:  Take 15 20 grams of rapid-acting glucose or carbohydrate. Rapid-acting options include glucose gel, glucose tablets, or 4 ounces (120 mL) of fruit juice, regular soda, or low fat milk.  Check your blood glucose level 15 minutes after taking the glucose.   Take 15 20 grams more of glucose if the repeat blood glucose level is still 70 mg/dL or below.  Eat a meal or snack within 1 hour once blood glucose levels return to normal.  Be alert to polyuria and polydipsia which are early  signs of hyperglycemia. An early awareness of hyperglycemia allows for prompt treatment. Treat hyperglycemia as directed by your caregiver.  Engage in at least 30 minutes of physical activity a day or as directed by your caregiver. Ten minutes of physical activity timed 30 minutes after each meal is encouraged to control postprandial blood glucose levels.  Adjust your insulin dosing and food intake as needed if you start a new exercise or sport.  Follow your sick day plan at any time you are unable to eat or drink as usual.  Avoid tobacco and alcohol use.  Follow up with your caregiver regularly.  Follow the advice of your caregiver regarding your prenatal and post-delivery (postpartum) appointments, meal planning, exercise, medicines, vitamins, blood tests, other medical tests, and physical activities.  Perform daily skin and foot care. Examine your skin and feet daily for cuts, bruises, redness, nail problems, bleeding, blisters, or sores.  Brush your teeth and gums at least twice a day and floss at least once a day. Follow up with your dentist regularly.  Schedule an eye exam during the first trimester of your pregnancy or as directed by your caregiver.  Share your diabetes management plan with your workplace or school.  Stay up-to-date with immunizations.  Learn to manage stress.  Obtain ongoing diabetes education and   support as needed. SEEK MEDICAL CARE IF:   You are unable to eat food or drink fluids for more than 6 hours.  You have nausea and vomiting for more than 6 hours.  You have a blood glucose level of 200 mg/dL and you have ketones in your urine.  There is a change in mental status.  You develop vision problems.  You have a persistent headache.  You have upper abdominal pain or discomfort.  You develop an additional serious illness.  You have diarrhea for more than 6 hours.  You have been sick or have had a fever for a couple of days and are not getting  better. SEEK IMMEDIATE MEDICAL CARE IF:   You have difficulty breathing.  You no longer feel the baby moving.  You are bleeding or have discharge from your vagina.  You start having premature contractions or labor. MAKE SURE YOU:  Understand these instructions.  Will watch your condition.  Will get help right away if you are not doing well or get worse. Document Released: 11/28/2000 Document Revised: 12/17/2012 Document Reviewed: 03/20/2012 [employer] Patient Information 2014 [employer].  Breastfeeding Deciding to breastfeed is one of the best choices you can make for you and your baby. A change in hormones during pregnancy causes your breast tissue to grow and increases the number and size of your milk ducts. These hormones also allow proteins, sugars, and fats from your blood supply to make breast milk in your milk-producing glands. Hormones prevent breast milk from being released before your baby is born as well as prompt milk flow after birth. Once breastfeeding has begun, thoughts of your baby, as well as his or her sucking or crying, can stimulate the release of milk from your milk-producing glands.  BENEFITS OF BREASTFEEDING For Your Baby  Your first milk (colostrum) helps your baby's digestive system function better.   There are antibodies in your milk that help your baby fight off infections.   Your baby has a lower incidence of asthma, allergies, and sudden infant death syndrome.   The nutrients in breast milk are better for your baby than infant formulas and are designed uniquely for your baby's needs.   Breast milk improves your baby's brain development.   Your baby is less likely to develop other conditions, such as childhood obesity, asthma, or type 2 diabetes mellitus.  For You   Breastfeeding helps to create a very special bond between you and your baby.   Breastfeeding is convenient. Breast milk is always available at the correct temperature and  costs nothing.   Breastfeeding helps to burn calories and helps you lose the weight gained during pregnancy.   Breastfeeding makes your uterus contract to its prepregnancy size faster and slows bleeding (lochia) after you give birth.   Breastfeeding helps to lower your risk of developing type 2 diabetes mellitus, osteoporosis, and breast or ovarian cancer later in life. SIGNS THAT YOUR BABY IS HUNGRY Early Signs of Hunger  Increased alertness or activity.  Stretching.  Movement of the head from side to side.  Movement of the head and opening of the mouth when the corner of the mouth or cheek is stroked (rooting).  Increased sucking sounds, smacking lips, cooing, sighing, or squeaking.  Hand-to-mouth movements.  Increased sucking of fingers or hands. Late Signs of Hunger  Fussing.  Intermittent crying. Extreme Signs of Hunger Signs of extreme hunger will require calming and consoling before your baby will be able to breastfeed successfully. Do not   wait for the following signs of extreme hunger to occur before you initiate breastfeeding:   Restlessness.  A loud, strong cry.   Screaming. BREASTFEEDING BASICS Breastfeeding Initiation  Find a comfortable place to sit or lie down, with your neck and back well supported.  Place a pillow or rolled up blanket under your baby to bring him or her to the level of your breast (if you are seated). Nursing pillows are specially designed to help support your arms and your baby while you breastfeed.  Make sure that your baby's abdomen is facing your abdomen.   Gently massage your breast. With your fingertips, massage from your chest wall toward your nipple in a circular motion. This encourages milk flow. You may need to continue this action during the feeding if your milk flows slowly.  Support your breast with 4 fingers underneath and your thumb above your nipple. Make sure your fingers are well away from your nipple and your  baby's mouth.   Stroke your baby's lips gently with your finger or nipple.   When your baby's mouth is open wide enough, quickly bring your baby to your breast, placing your entire nipple and as much of the colored area around your nipple (areola) as possible into your baby's mouth.   More areola should be visible above your baby's upper lip than below the lower lip.   Your baby's tongue should be between his or her lower gum and your breast.   Ensure that your baby's mouth is correctly positioned around your nipple (latched). Your baby's lips should create a seal on your breast and be turned out (everted).  It is common for your baby to suck about 2 3 minutes in order to start the flow of breast milk. Latching Teaching your baby how to latch on to your breast properly is very important. An improper latch can cause nipple pain and decreased milk supply for you and poor weight gain in your baby. Also, if your baby is not latched onto your nipple properly, he or she may swallow some air during feeding. This can make your baby fussy. Burping your baby when you switch breasts during the feeding can help to get rid of the air. However, teaching your baby to latch on properly is still the best way to prevent fussiness from swallowing air while breastfeeding. Signs that your baby has successfully latched on to your nipple:    Silent tugging or silent sucking, without causing you pain.   Swallowing heard between every 3 4 sucks.    Muscle movement above and in front of his or her ears while sucking.  Signs that your baby has not successfully latched on to nipple:   Sucking sounds or smacking sounds from your baby while breastfeeding.  Nipple pain. If you think your baby has not latched on correctly, slip your finger into the corner of your baby's mouth to break the suction and place it between your baby's gums. Attempt breastfeeding initiation again. Signs of Successful  Breastfeeding Signs from your baby:   A gradual decrease in the number of sucks or complete cessation of sucking.   Falling asleep.   Relaxation of his or her body.   Retention of a small amount of milk in his or her mouth.   Letting go of your breast by himself or herself. Signs from you:  Breasts that have increased in firmness, weight, and size 1 3 hours after feeding.   Breasts that are softer immediately after breastfeeding.    Increased milk volume, as well as a change in milk consistency and color by the 5th day of breastfeeding.   Nipples that are not sore, cracked, or bleeding. Signs That Your Baby is Getting Enough Milk  Wetting at least 3 diapers in a 24-hour period. The urine should be clear and pale yellow by age 5 days.  At least 3 stools in a 24-hour period by age 5 days. The stool should be soft and yellow.  At least 3 stools in a 24-hour period by age 7 days. The stool should be seedy and yellow.  No loss of weight greater than 10% of birth weight during the first 3 days of age.  Average weight gain of 4 7 ounces (120 210 mL) per week after age 4 days.  Consistent daily weight gain by age 5 days, without weight loss after the age of 2 weeks. After a feeding, your baby may spit up a small amount. This is common. BREASTFEEDING FREQUENCY AND DURATION Frequent feeding will help you make more milk and can prevent sore nipples and breast engorgement. Breastfeed when you feel the need to reduce the fullness of your breasts or when your baby shows signs of hunger. This is called "breastfeeding on demand." Avoid introducing a pacifier to your baby while you are working to establish breastfeeding (the first 4 6 weeks after your baby is born). After this time you may choose to use a pacifier. Research has shown that pacifier use during the first year of a baby's life decreases the risk of sudden infant death syndrome (SIDS). Allow your baby to feed on each breast as  long as he or she wants. Breastfeed until your baby is finished feeding. When your baby unlatches or falls asleep while feeding from the first breast, offer the second breast. Because newborns are often sleepy in the first few weeks of life, you may need to awaken your baby to get him or her to feed. Breastfeeding times will vary from baby to baby. However, the following rules can serve as a guide to help you ensure that your baby is properly fed:  Newborns (babies 4 weeks of age or younger) may breastfeed every 1 3 hours.  Newborns should not go longer than 3 hours during the day or 5 hours during the night without breastfeeding.  You should breastfeed your baby a minimum of 8 times in a 24-hour period until you begin to introduce solid foods to your baby at around 6 months of age. BREAST MILK PUMPING Pumping and storing breast milk allows you to ensure that your baby is exclusively fed your breast milk, even at times when you are unable to breastfeed. This is especially important if you are going back to work while you are still breastfeeding or when you are not able to be present during feedings. Your lactation consultant can give you guidelines on how long it is safe to store breast milk.  A breast pump is a machine that allows you to pump milk from your breast into a sterile bottle. The pumped breast milk can then be stored in a refrigerator or freezer. Some breast pumps are operated by hand, while others use electricity. Ask your lactation consultant which type will work best for you. Breast pumps can be purchased, but some hospitals and breastfeeding support groups lease breast pumps on a monthly basis. A lactation consultant can teach you how to hand express breast milk, if you prefer not to use a pump.  CARING FOR   YOUR BREASTS WHILE YOU BREASTFEED Nipples can become dry, cracked, and sore while breastfeeding. The following recommendations can help keep your breasts moisturized and  healthy:  Avoid using soap on your nipples.   Wear a supportive bra. Although not required, special nursing bras and tank tops are designed to allow access to your breasts for breastfeeding without taking off your entire bra or top. Avoid wearing underwire style bras or extremely tight bras.  Air dry your nipples for 3 4minutes after each feeding.   Use only cotton bra pads to absorb leaked breast milk. Leaking of breast milk between feedings is normal.   Use lanolin on your nipples after breastfeeding. Lanolin helps to maintain your skin's normal moisture barrier. If you use pure lanolin you do not need to wash it off before feeding your baby again. Pure lanolin is not toxic to your baby. You may also hand express a few drops of breast milk and gently massage that milk into your nipples and allow the milk to air dry. In the first few weeks after giving birth, some women experience extremely full breasts (engorgement). Engorgement can make your breasts feel heavy, warm, and tender to the touch. Engorgement peaks within 3 5 days after you give birth. The following recommendations can help ease engorgement:  Completely empty your breasts while breastfeeding or pumping. You may want to start by applying warm, moist heat (in the shower or with warm water-soaked hand towels) just before feeding or pumping. This increases circulation and helps the milk flow. If your baby does not completely empty your breasts while breastfeeding, pump any extra milk after he or she is finished.  Wear a snug bra (nursing or regular) or tank top for 1 2 days to signal your body to slightly decrease milk production.  Apply ice packs to your breasts, unless this is too uncomfortable for you.  Make sure that your baby is latched on and positioned properly while breastfeeding. If engorgement persists after 48 hours of following these recommendations, contact your health care provider or a lactation consultant. OVERALL  HEALTH CARE RECOMMENDATIONS WHILE BREASTFEEDING  Eat healthy foods. Alternate between meals and snacks, eating 3 of each per day. Because what you eat affects your breast milk, some of the foods may make your baby more irritable than usual. Avoid eating these foods if you are sure that they are negatively affecting your baby.  Drink milk, fruit juice, and water to satisfy your thirst (about 10 glasses a day).   Rest often, relax, and continue to take your prenatal vitamins to prevent fatigue, stress, and anemia.  Continue breast self-awareness checks.  Avoid chewing and smoking tobacco.  Avoid alcohol and drug use. Some medicines that may be harmful to your baby can pass through breast milk. It is important to ask your health care provider before taking any medicine, including all over-the-counter and prescription medicine as well as vitamin and herbal supplements. It is possible to become pregnant while breastfeeding. If birth control is desired, ask your health care provider about options that will be safe for your baby. SEEK MEDICAL CARE IF:   You feel like you want to stop breastfeeding or have become frustrated with breastfeeding.  You have painful breasts or nipples.  Your nipples are cracked or bleeding.  Your breasts are red, tender, or warm.  You have a swollen area on either breast.  You have a fever or chills.  You have nausea or vomiting.  You have drainage other than breast   milk from your nipples.  Your breasts do not become full before feedings by the 5th day after you give birth.  You feel sad and depressed.  Your baby is too sleepy to eat well.  Your baby is having trouble sleeping.   Your baby is wetting less than 3 diapers in a 24-hour period.  Your baby has less than 3 stools in a 24-hour period.  Your baby's skin or the white part of his or her eyes becomes yellow.   Your baby is not gaining weight by 5 days of age. SEEK IMMEDIATE MEDICAL CARE  IF:   Your baby is overly tired (lethargic) and does not want to wake up and feed.  Your baby develops an unexplained fever. Document Released: 08/22/2005 Document Revised: 04/24/2013 Document Reviewed: 02/13/2013 [employer] Patient Information 2014 [employer].  

## 2013-12-17 ENCOUNTER — Other Ambulatory Visit: Payer: Self-pay

## 2013-12-17 DIAGNOSIS — O09292 Supervision of pregnancy with other poor reproductive or obstetric history, second trimester: Secondary | ICD-10-CM

## 2013-12-17 DIAGNOSIS — Z8632 Personal history of gestational diabetes: Principal | ICD-10-CM

## 2013-12-17 DIAGNOSIS — O24419 Gestational diabetes mellitus in pregnancy, unspecified control: Secondary | ICD-10-CM

## 2013-12-17 MED ORDER — GLUCOSE BLOOD VI STRP: 1.0000 | ORAL_STRIP | Freq: Four times a day (QID) | Status: DC

## 2013-12-19 ENCOUNTER — Telehealth: Payer: Self-pay | Admitting: *Deleted

## 2013-12-19 NOTE — Telephone Encounter (Signed)
Patient called to front office stating she got her strips and lancets for free but the meter was $30 and she wasn't sure if this was the cost or if it's supposed to be cheaper. Told patient I would call the pharmacy and figured out a cheaper price for you. Called [facility] outpatient pharmacy and they were able to print free voucher for patient and get meter ready. Called patient and informed [facility] and how to get there. Patient verbalized understanding was grateful and had no further questions

## 2013-12-23 ENCOUNTER — Encounter: Payer: Medicaid Other | Admitting: Family Medicine

## 2013-12-24 ENCOUNTER — Ambulatory Visit (INDEPENDENT_AMBULATORY_CARE_PROVIDER_SITE_OTHER): Payer: Medicaid Other | Admitting: Obstetrics & Gynecology

## 2013-12-24 ENCOUNTER — Encounter: Payer: Self-pay | Admitting: Obstetrics & Gynecology

## 2013-12-24 VITALS — BP 119/74 | HR 78 | Temp 97.8°F | Wt 189.7 lb

## 2013-12-24 DIAGNOSIS — O09299 Supervision of pregnancy with other poor reproductive or obstetric history, unspecified trimester: Secondary | ICD-10-CM

## 2013-12-24 DIAGNOSIS — Z8632 Personal history of gestational diabetes: Principal | ICD-10-CM

## 2013-12-24 DIAGNOSIS — O99810 Abnormal glucose complicating pregnancy: Secondary | ICD-10-CM

## 2013-12-24 DIAGNOSIS — O09292 Supervision of pregnancy with other poor reproductive or obstetric history, second trimester: Secondary | ICD-10-CM

## 2013-12-24 LAB — POCT URINALYSIS DIP (DEVICE)
Bilirubin Urine: NEGATIVE
Glucose, UA: NEGATIVE mg/dL
HGB URINE DIPSTICK: NEGATIVE
KETONES UR: NEGATIVE mg/dL
Nitrite: NEGATIVE
PH: 7 (ref 5.0–8.0)
PROTEIN: NEGATIVE mg/dL
SPECIFIC GRAVITY, URINE: 1.02 (ref 1.005–1.030)
Urobilinogen, UA: 0.2 mg/dL (ref 0.0–1.0)

## 2013-12-24 NOTE — Progress Notes (Signed)
Routine visit. Good FM. She did not bring her sugars but remembers her fastings to be around 100 and her 2 hour PPs 120s. We again discussed the timing of her IOL. She will bring her sugars next week. Labor precautions reviewed.

## 2013-12-24 NOTE — Progress Notes (Signed)
Edema-feet   

## 2013-12-24 NOTE — Progress Notes (Signed)
Routine visit. Good FM. 66% growth on u/s 12-13-13. Labor precautions reviewed.

## 2013-12-30 ENCOUNTER — Telehealth (HOSPITAL_COMMUNITY): Payer: Self-pay | Admitting: *Deleted

## 2013-12-30 ENCOUNTER — Ambulatory Visit (INDEPENDENT_AMBULATORY_CARE_PROVIDER_SITE_OTHER): Payer: Medicaid Other | Admitting: Obstetrics & Gynecology

## 2013-12-30 ENCOUNTER — Encounter (HOSPITAL_COMMUNITY): Payer: Self-pay | Admitting: *Deleted

## 2013-12-30 VITALS — BP 105/66 | Temp 97.9°F | Wt 190.9 lb

## 2013-12-30 DIAGNOSIS — Z283 Underimmunization status: Secondary | ICD-10-CM

## 2013-12-30 DIAGNOSIS — O9989 Other specified diseases and conditions complicating pregnancy, childbirth and the puerperium: Secondary | ICD-10-CM

## 2013-12-30 DIAGNOSIS — O24419 Gestational diabetes mellitus in pregnancy, unspecified control: Secondary | ICD-10-CM

## 2013-12-30 DIAGNOSIS — O09529 Supervision of elderly multigravida, unspecified trimester: Secondary | ICD-10-CM

## 2013-12-30 DIAGNOSIS — Z2839 Other underimmunization status: Secondary | ICD-10-CM

## 2013-12-30 DIAGNOSIS — O99810 Abnormal glucose complicating pregnancy: Secondary | ICD-10-CM

## 2013-12-30 LAB — POCT URINALYSIS DIP (DEVICE)
Bilirubin Urine: NEGATIVE
Glucose, UA: NEGATIVE mg/dL
HGB URINE DIPSTICK: NEGATIVE
KETONES UR: NEGATIVE mg/dL
Nitrite: NEGATIVE
PH: 7 (ref 5.0–8.0)
PROTEIN: NEGATIVE mg/dL
Specific Gravity, Urine: 1.02 (ref 1.005–1.030)
UROBILINOGEN UA: 1 mg/dL (ref 0.0–1.0)

## 2013-12-30 NOTE — Progress Notes (Signed)
P=94  Pt is having irregular contractions and hemorrhoids.

## 2013-12-30 NOTE — Telephone Encounter (Signed)
Preadmission screen  

## 2013-12-30 NOTE — Patient Instructions (Signed)
Return to clinic for any obstetric concerns or go to [facility] for evaluation  

## 2013-12-30 NOTE — Progress Notes (Signed)
Fastings 109/98/99/124/117/104/109/95/110/97  2 hr PP B [id number]  L [id number]  [id number]. 12/14/13 EFW 66% Instead of starting medications now, will schedule IOL at 39 weeks (scheduled on 01/03/14 as per patient preference and childcare issues, unable to come in 01/01/14). Patient agrees with this plan, will really stick to diet and add walking to regimen. Small hemorrhoid noted on external exam. Recommended OTC therapies for hemorrhoids, will continue to observe.  No other complaints or concerns.  Fetal movement and labor precautions reviewed.

## 2014-01-02 ENCOUNTER — Encounter (HOSPITAL_COMMUNITY): Payer: Medicaid Other | Admitting: Anesthesiology

## 2014-01-02 ENCOUNTER — Inpatient Hospital Stay (HOSPITAL_COMMUNITY): Payer: Medicaid Other | Admitting: Anesthesiology

## 2014-01-02 ENCOUNTER — Inpatient Hospital Stay (HOSPITAL_COMMUNITY)
Admission: AD | Admit: 2014-01-02 | Discharge: 2014-01-04 | DRG: 774 | Disposition: A | Discharge status: Home / Self Care | Payer: Medicaid Other | Source: Ambulatory Visit | Attending: Obstetrics & Gynecology | Admitting: Obstetrics & Gynecology

## 2014-01-02 ENCOUNTER — Encounter (HOSPITAL_COMMUNITY): Payer: Self-pay | Admitting: *Deleted

## 2014-01-02 DIAGNOSIS — O99814 Abnormal glucose complicating childbirth: Principal | ICD-10-CM | POA: Diagnosis present

## 2014-01-02 DIAGNOSIS — Z87891 Personal history of nicotine dependence: Secondary | ICD-10-CM

## 2014-01-02 DIAGNOSIS — Z22330 Carrier of Group B streptococcus: Secondary | ICD-10-CM

## 2014-01-02 DIAGNOSIS — E079 Disorder of thyroid, unspecified: Secondary | ICD-10-CM

## 2014-01-02 DIAGNOSIS — Z8249 Family history of ischemic heart disease and other diseases of the circulatory system: Secondary | ICD-10-CM

## 2014-01-02 DIAGNOSIS — Z823 Family history of stroke: Secondary | ICD-10-CM

## 2014-01-02 DIAGNOSIS — O9852 Other viral diseases complicating childbirth: Secondary | ICD-10-CM

## 2014-01-02 DIAGNOSIS — IMO0001 Reserved for inherently not codable concepts without codable children: Secondary | ICD-10-CM

## 2014-01-02 DIAGNOSIS — O99892 Other specified diseases and conditions complicating childbirth: Secondary | ICD-10-CM | POA: Diagnosis present

## 2014-01-02 DIAGNOSIS — O9989 Other specified diseases and conditions complicating pregnancy, childbirth and the puerperium: Secondary | ICD-10-CM

## 2014-01-02 DIAGNOSIS — B069 Rubella without complication: Secondary | ICD-10-CM | POA: Diagnosis present

## 2014-01-02 DIAGNOSIS — O09529 Supervision of elderly multigravida, unspecified trimester: Secondary | ICD-10-CM | POA: Diagnosis present

## 2014-01-02 DIAGNOSIS — Z833 Family history of diabetes mellitus: Secondary | ICD-10-CM

## 2014-01-02 DIAGNOSIS — O99284 Endocrine, nutritional and metabolic diseases complicating childbirth: Secondary | ICD-10-CM

## 2014-01-02 LAB — CBC
HEMATOCRIT: 32.7 % — AB (ref 36.0–46.0)
HEMOGLOBIN: 10.5 g/dL — AB (ref 12.0–15.0)
MCH: 26.1 pg (ref 26.0–34.0)
MCHC: 32.1 g/dL (ref 30.0–36.0)
MCV: 81.3 fL (ref 78.0–100.0)
Platelets: 355 10*3/uL (ref 150–400)
RBC: 4.02 MIL/uL (ref 3.87–5.11)
RDW: 15.2 % (ref 11.5–15.5)
WBC: 8.8 10*3/uL (ref 4.0–10.5)

## 2014-01-02 LAB — RPR

## 2014-01-02 LAB — GLUCOSE, CAPILLARY: Glucose-Capillary: 90 mg/dL (ref 70–99)

## 2014-01-02 MED ORDER — EPHEDRINE 5 MG/ML INJ
10.0000 mg | INTRAVENOUS | Status: DC | PRN
  Filled 2014-01-02: qty 2

## 2014-01-02 MED ORDER — FENTANYL 2.5 MCG/ML BUPIVACAINE 1/10 % EPIDURAL INFUSION (WH - ANES): 14.0000 mL/h | INTRAMUSCULAR | Status: DC | PRN

## 2014-01-02 MED ORDER — PHENYLEPHRINE 40 MCG/ML (10ML) SYRINGE FOR IV PUSH (FOR BLOOD PRESSURE SUPPORT)
80.0000 ug | PREFILLED_SYRINGE | INTRAVENOUS | Status: DC | PRN
  Filled 2014-01-02: qty 2

## 2014-01-02 MED ORDER — TETANUS-DIPHTH-ACELL PERTUSSIS 5-2.5-18.5 LF-MCG/0.5 IM SUSP
0.5000 mL | Freq: Once | INTRAMUSCULAR | Status: DC
Start: 2014-01-03 — End: 2014-01-04

## 2014-01-02 MED ORDER — IBUPROFEN 600 MG PO TABS
600.0000 mg | ORAL_TABLET | Freq: Four times a day (QID) | ORAL | Status: DC
  Administered 2014-01-02 – 2014-01-04 (×7): 600 mg via ORAL
  Filled 2014-01-02 (×7): qty 1

## 2014-01-02 MED ORDER — WITCH HAZEL-GLYCERIN EX PADS: 1.0000 "application " | MEDICATED_PAD | CUTANEOUS | Status: DC | PRN

## 2014-01-02 MED ORDER — DIPHENHYDRAMINE HCL 50 MG/ML IJ SOLN: 12.5000 mg | INTRAMUSCULAR | Status: DC | PRN

## 2014-01-02 MED ORDER — ONDANSETRON HCL 4 MG/2ML IJ SOLN: 4.0000 mg | Freq: Four times a day (QID) | INTRAMUSCULAR | Status: DC | PRN

## 2014-01-02 MED ORDER — SIMETHICONE 80 MG PO CHEW: 80.0000 mg | CHEWABLE_TABLET | ORAL | Status: DC | PRN

## 2014-01-02 MED ORDER — ONDANSETRON HCL 4 MG/2ML IJ SOLN: 4.0000 mg | INTRAMUSCULAR | Status: DC | PRN

## 2014-01-02 MED ORDER — EPHEDRINE 5 MG/ML INJ
INTRAVENOUS | Status: AC
  Filled 2014-01-02: qty 4

## 2014-01-02 MED ORDER — OXYCODONE-ACETAMINOPHEN 5-325 MG PO TABS
1.0000 | ORAL_TABLET | ORAL | Status: DC | PRN
  Administered 2014-01-03 (×2): 2 via ORAL
  Administered 2014-01-03: 1 via ORAL
  Administered 2014-01-04 (×2): 2 via ORAL
  Filled 2014-01-02: qty 1
  Filled 2014-01-02 (×4): qty 2

## 2014-01-02 MED ORDER — OXYCODONE-ACETAMINOPHEN 5-325 MG PO TABS
1.0000 | ORAL_TABLET | ORAL | Status: DC | PRN
  Administered 2014-01-02: 1 via ORAL
  Filled 2014-01-02: qty 1

## 2014-01-02 MED ORDER — PHENYLEPHRINE 40 MCG/ML (10ML) SYRINGE FOR IV PUSH (FOR BLOOD PRESSURE SUPPORT)
PREFILLED_SYRINGE | INTRAVENOUS | Status: AC
  Filled 2014-01-02: qty 10

## 2014-01-02 MED ORDER — SODIUM BICARBONATE 8.4 % IV SOLN
INTRAVENOUS | Status: DC | PRN
  Administered 2014-01-02 (×2): 5 mL via EPIDURAL

## 2014-01-02 MED ORDER — OXYTOCIN 40 UNITS IN LACTATED RINGERS INFUSION - SIMPLE MED
62.5000 mL/h | INTRAVENOUS | Status: DC
  Administered 2014-01-02: 62.5 mL/h via INTRAVENOUS
  Filled 2014-01-02: qty 1000

## 2014-01-02 MED ORDER — DIBUCAINE 1 % RE OINT: 1.0000 "application " | TOPICAL_OINTMENT | RECTAL | Status: DC | PRN

## 2014-01-02 MED ORDER — SODIUM CHLORIDE 0.9 % IV SOLN
2.0000 g | Freq: Once | INTRAVENOUS | Status: AC
  Administered 2014-01-02: 2 g via INTRAVENOUS
  Filled 2014-01-02: qty 2000

## 2014-01-02 MED ORDER — SODIUM CHLORIDE 0.9 % IV SOLN
1.0000 g | INTRAVENOUS | Status: DC
  Filled 2014-01-02 (×4): qty 1000

## 2014-01-02 MED ORDER — IBUPROFEN 600 MG PO TABS
600.0000 mg | ORAL_TABLET | Freq: Four times a day (QID) | ORAL | Status: DC | PRN
  Administered 2014-01-02: 600 mg via ORAL
  Filled 2014-01-02: qty 1

## 2014-01-02 MED ORDER — CITRIC ACID-SODIUM CITRATE 334-500 MG/5ML PO SOLN: 30.0000 mL | ORAL | Status: DC | PRN

## 2014-01-02 MED ORDER — PRENATAL MULTIVITAMIN CH
1.0000 | ORAL_TABLET | Freq: Every day | ORAL | Status: DC
  Administered 2014-01-03 – 2014-01-04 (×2): 1 via ORAL
  Filled 2014-01-02 (×2): qty 1

## 2014-01-02 MED ORDER — LACTATED RINGERS IV SOLN: 500.0000 mL | INTRAVENOUS | Status: DC | PRN

## 2014-01-02 MED ORDER — BENZOCAINE-MENTHOL 20-0.5 % EX AERO
1.0000 "application " | INHALATION_SPRAY | CUTANEOUS | Status: DC | PRN
  Administered 2014-01-02: 1 via TOPICAL
  Filled 2014-01-02 (×2): qty 56

## 2014-01-02 MED ORDER — LIDOCAINE HCL (PF) 1 % IJ SOLN
INTRAMUSCULAR | Status: DC | PRN
  Administered 2014-01-02 (×2): 4 mL

## 2014-01-02 MED ORDER — FENTANYL 2.5 MCG/ML BUPIVACAINE 1/10 % EPIDURAL INFUSION (WH - ANES)
INTRAMUSCULAR | Status: DC | PRN
  Administered 2014-01-02: 14 mL/h via EPIDURAL

## 2014-01-02 MED ORDER — ZOLPIDEM TARTRATE 5 MG PO TABS: 5.0000 mg | ORAL_TABLET | Freq: Every evening | ORAL | Status: DC | PRN

## 2014-01-02 MED ORDER — LANOLIN HYDROUS EX OINT: TOPICAL_OINTMENT | CUTANEOUS | Status: DC | PRN

## 2014-01-02 MED ORDER — FENTANYL 2.5 MCG/ML BUPIVACAINE 1/10 % EPIDURAL INFUSION (WH - ANES)
INTRAMUSCULAR | Status: AC
  Filled 2014-01-02: qty 125

## 2014-01-02 MED ORDER — LACTATED RINGERS IV SOLN
500.0000 mL | Freq: Once | INTRAVENOUS | Status: AC
Start: 2014-01-02 — End: 2014-01-02
  Administered 2014-01-02: 500 mL via INTRAVENOUS

## 2014-01-02 MED ORDER — ONDANSETRON HCL 4 MG PO TABS: 4.0000 mg | ORAL_TABLET | ORAL | Status: DC | PRN

## 2014-01-02 MED ORDER — LIDOCAINE HCL (PF) 1 % IJ SOLN
30.0000 mL | INTRAMUSCULAR | Status: DC | PRN
  Filled 2014-01-02: qty 30

## 2014-01-02 MED ORDER — OXYTOCIN BOLUS FROM INFUSION: 500.0000 mL | INTRAVENOUS | Status: DC

## 2014-01-02 MED ORDER — SENNOSIDES-DOCUSATE SODIUM 8.6-50 MG PO TABS
2.0000 | ORAL_TABLET | ORAL | Status: DC
  Administered 2014-01-03 – 2014-01-04 (×2): 2 via ORAL
  Filled 2014-01-02 (×2): qty 2

## 2014-01-02 MED ORDER — DIPHENHYDRAMINE HCL 25 MG PO CAPS: 25.0000 mg | ORAL_CAPSULE | Freq: Four times a day (QID) | ORAL | Status: DC | PRN

## 2014-01-02 MED ORDER — ACETAMINOPHEN 325 MG PO TABS: 650.0000 mg | ORAL_TABLET | ORAL | Status: DC | PRN

## 2014-01-02 MED ORDER — LACTATED RINGERS IV SOLN
INTRAVENOUS | Status: DC
  Administered 2014-01-02: 11:00:00 via INTRAVENOUS

## 2014-01-02 NOTE — [facility] Note (Signed)
Regular UC's since 0500

## 2014-01-02 NOTE — H&P (Signed)
Pamela [name] is a 38 y.o. female [phone] with IUP at 30w1dpresenting for SOL, in addition to IOL for newly dx GDM.  Patient reports that regular painful contractions started to get significantly stronger around 0500 today.   Admits good Fetal Movement. Denies any LOF, vaginal bleeding.  Prenatal History/Complications[facility] Pregnancy complicated by Late PNC, AMA (38 yrs), 3rd trimester GDM-Diet controlled (failed 3 hr GTT), prior GDM with other pregnancies with Glyburide (not currently) - Last [place]on 12/14/13 (381w2dEFW 6 lb 6 oz (66%tile)   Prior Pregnancy Hx: - x2 IOL 39 weeks, at 7 lb 8oz, 7 lb 14.2 oz  Past Medical History: Past Medical History  Diagnosis Date  . Allergy     RHINITIS  . Thyroid disease     THYROID CYST  . Diabetes mellitus   . Abnormal Pap smear   . Postpartum care following vaginal delivery (08/04/11) 08/05/2011  . History of gestational diabetes in prior pregnancy, currently pregnant in second trimester 08/14/2013    A2 GDM x2 on glyburide with both, normal testing after pregnancy, both [facility]  Anatomic [place] but needs F/U anatomy 4-6 wks> nl  Glucose Screen  early screen  11/25/2013 157 11/29/2013 Abnl 3 hour GTT 97/154/168/125  GBS pos  Feeding Preference breast  Contraception OCP's (undecided)  Circumcision Yes   Flu Vaccine: 08/14/2013 TDaP:11/25/2013   . Gestational diabetes     diet controlled  . Vaginal Pap smear, abnormal     Past Surgical History: Past Surgical History  Procedure Laterality Date  . Colposcopy  2004    Obstetrical History: OB History   Grav Para Term Preterm Abortions TAB SAB Ect Mult Living   _0 Social History: History   Social History  . Marital Status: Single    Spouse Name: N/A    Number of Children: 1  . Years of Education: N/A   Occupational History  . [employer]    Social History Main Topics  . Smoking status: Former Smoker     Types: Cigars    Quit date: 09/05/2006  . Smokeless tobacco: Never Used     Comment: smoked socially for a few yrs  . Alcohol Use: No  . Drug Use: No  . Sexual Activity: Yes   Other Topics Concern  . None   Social History Narrative  . None    Family History: Family History  Problem Relation Age of Onset  . Diabetes Father   . Heart disease Father   . Hypertension Father   . Prostate cancer Father   . Stroke Father   . Asthma Sister   . Hypertension Sister   . Arthritis Paternal Grandmother   . Hypertension Paternal Grandmother   . Diabetes Paternal Grandmother   . Stroke Paternal Grandmother   . Arthritis Paternal Grandfather   . Hypertension Paternal Grandfather   . Multiple sclerosis Mother     Allergies: Allergies  Allergen Reactions  . Cephalexin Hives    Prescriptions prior to admission  Medication Sig Dispense Refill  . calcium carbonate (TUMS - DOSED IN MG ELEMENTAL CALCIUM) 500 MG chewable tablet Chew 1 tablet by mouth daily as needed for indigestion or heartburn.      . flintstones complete (FLINTSTONES) 60 MG chewable tablet Chew 2 tablets by mouth daily.      . Marland KitchenCCU-CHEK FASTCLIX LANCETS  MISC 1 Device by Percutaneous route 4 (four) times daily.  100 each  12  . Blood Glucose Monitoring Suppl (ACCU-CHEK NANO SMARTVIEW) W/DEVICE KIT 1 Units by Does not apply route as directed.  1 kit  0  . glucose blood test strip 1 each by Other route 4 (four) times daily. Diagnosis code: 648.83. Accucheck nano.  100 each  12    Review of Systems: Negative unless otherwise stated in History above  Physicial Blood pressure 123/76, pulse 73, temperature 97.7 F (36.5 C), temperature source Oral, resp. rate 20, height _0  (1.702 m), weight 86.183 kg (190 lb). General appearance: alert and cooperative, uncomfortable with regular ctx Lungs: clear to auscultation bilaterally Heart: regular rate and rhythm Abdomen: soft, gravid for GA appropriate Extremities: Homans sign  is negative, no sign of DVT DTR's +2 Presentation: cephalic  (confirmed bedside US) Fetal monitoringBaseline: 140 bpm, moderate variability, accels present 15x15, no decels Uterine activity: Regular q 2-4 min  Cervical Exam: 7 cm / 90% (Dr. Leslie Andrea)  Prenatal labs: ABO, Rh: O/POS/-- (12/10 1524) Antibody: NEG (12/10 1524) Rubella:   non-immune RPR: NON REAC (03/23 1559)  HBsAg: NEGATIVE (12/10 1524)  HIV: NON REACTIVE (03/23 1559)  GBS: Positive (04/08 0000)  GTT: failed 3 hr 97/154/168/125  Prenatal Transfer Tool  Maternal Diabetes: Yes:  Diabetes Type:  Diet controlled Genetic Screening: Normal Maternal Ultrasounds/Referrals: Normal 66%tile Fetal Ultrasounds or other Referrals:  None Maternal Substance Abuse:  No Significant Maternal Medications:  None Significant Maternal Lab Results: Lab values include: Group B Strep positive  No results found for this or any previous visit (from the past 24 hour(s)). A2 GDM x2 on glyburide with both, normal testing after pregnancy, both AGA Clinic: [facility] Genetic Screen Quad nl  Anatomic [place] Normal but needs F/U anatomy 4-6 wks> nl  Glucose Screen  early screen  11/25/2013 157 11/29/2013 Abnl 3 hour GTT 97/154/168/125  GBS pos  Feeding Preference breast  Contraception OCP's (undecided)  Circumcision Yes   Flu Vaccine: 08/14/2013 TDaP:11/25/2013  Assessment: Pamela [name] is a 38 y.o. [id number] at 92w1dpresented for SOL with intact membranes, prior planned IOL at 39 weeks for GDM (A2, diet).  #Labor: SOL, expectant management with significant recent cervical progression. Anticipate delivery #Pain: IV pain meds, consider epidural #FWB: CAT-1, EFW >7 lb, (last UKorea36wk 6 lb 6 oz, 66%tile) #ID:  GBS (positive) - allergy to keflex (hives), ordered Ampicillin. Rubella non-immune (will require PP MMR) #Feeding: breastfeeding #MOC: OCP's (undecided) #Circ:  Yes  ANobie [name] [place] PGY-1  01/02/2014, 10:44  AM   I spoke with and examined patient and agree with resident's note and plan of care.  MFredrik Rigger MD OB Fellow 01/02/2014 11:57 AM

## 2014-01-02 NOTE — Anesthesia Preprocedure Evaluation (Signed)
Anesthesia Evaluation  Patient identified by MRN, date of birth, ID band Patient awake    Reviewed: Allergy & Precautions, H&P , Patient's Chart, lab work & pertinent test results  Airway Mallampati: III TM Distance: >3 FB Neck ROM: full    Dental no notable dental hx. (+) Teeth Intact   Pulmonary neg pulmonary ROS, former smoker,  breath sounds clear to auscultation  Pulmonary exam normal       Cardiovascular negative cardio ROS  Rhythm:regular Rate:Normal     Neuro/Psych negative neurological ROS  negative psych ROS   GI/Hepatic negative GI ROS, Neg liver ROS,   Endo/Other  negative endocrine ROSdiabetes  Renal/GU negative Renal ROS  negative genitourinary   Musculoskeletal   Abdominal Normal abdominal exam  (+)   Peds  Hematology negative hematology ROS (+)   Anesthesia Other Findings   Reproductive/Obstetrics (+) Pregnancy                           Anesthesia Physical Anesthesia Plan  ASA: II  Anesthesia Plan: Epidural   Post-op Pain Management:    Induction:   Airway Management Planned:   Additional Equipment:   Intra-op Plan:   Post-operative Plan:   Informed Consent: I have reviewed the patients History and Physical, chart, labs and discussed the procedure including the risks, benefits and alternatives for the proposed anesthesia with the patient or authorized representative who has indicated his/her understanding and acceptance.     Plan Discussed with: Anesthesiologist  Anesthesia Plan Comments:         Anesthesia Quick Evaluation

## 2014-01-02 NOTE — Anesthesia Postprocedure Evaluation (Signed)
  Anesthesia Post-op Note  Patient: Pamela [name]  Procedure(s) Performed: * No procedures listed *  Patient Location: Mother/Baby  Anesthesia Type:Epidural  Level of Consciousness: awake, alert , oriented and patient cooperative  Airway and Oxygen Therapy: Patient Spontanous Breathing  Post-op Pain: mild  Post-op Assessment: Post-op Vital signs reviewed, Patient's Cardiovascular Status Stable, Respiratory Function Stable, Patent Airway, No signs of Nausea or vomiting, Adequate PO intake, Pain level controlled, No headache and No backache  Post-op Vital Signs: Reviewed and stable  Last Vitals:  Filed Vitals:   01/02/14 1530  BP: 125/71  Pulse: 67  Temp: 36.7 C  Resp: 20    Complications: No apparent anesthesia complications

## 2014-01-02 NOTE — Anesthesia Procedure Notes (Signed)
Epidural Patient location during procedure: OB Start time: 01/02/2014 11:27 AM  Staffing Anesthesiologist: Arinze Rivadeneira A. Performed by: anesthesiologist   Preanesthetic Checklist Completed: patient identified, site marked, surgical consent, pre-op evaluation, timeout performed, IV checked, risks and benefits discussed and monitors and equipment checked  Epidural Patient position: sitting Prep: site prepped and draped and DuraPrep Patient monitoring: continuous pulse ox and blood pressure Approach: midline Location: L3-L4 Injection technique: LOR air  Needle:  Needle type: Tuohy  Needle gauge: 17 G Needle length: 9 cm and 9 Needle insertion depth: 6 cm Catheter type: closed end flexible Catheter size: 19 Gauge Catheter at skin depth: 11 cm Test dose: negative and Other  Assessment Events: blood not aspirated, injection not painful, no injection resistance, negative IV test and no paresthesia  Additional Notes Patient identified. Risks and benefits discussed including failed block, incomplete  Pain control, post dural puncture headache, nerve damage, paralysis, blood pressure Changes, nausea, vomiting, reactions to medications-both toxic and allergic and post Partum back pain. All questions were answered. Patient expressed understanding and wished to proceed. Sterile technique was used throughout procedure. Epidural site was Dressed with sterile barrier dressing. No paresthesias, signs of intravascular injection Or signs of intrathecal spread were encountered.  Patient was more comfortable after the epidural was dosed. Please see RN's note for documentation of vital signs and FHR which are stable.\

## 2014-01-02 NOTE — Lactation Note (Signed)
This note was copied from the chart of Pamela [name]. Lactation Consultation Note  Patient Name: Pamela Viann ShoveCandice Geraci ZOXWR'UToday's Date: 01/02/2014 Reason for consult: Initial assessment of this experienced multipara who breastfed her 38 yo >2 years and her 38 yo for 10 months (states she weaned second child earlier to prevent jealousy after weaning older child).  Mom is holding baby STS and no feeding cues are noted but baby had initial LATCH score=8 and despite needing small amount of formula due to low blood sugar(now resolved), mom states he was able to latch again to her breast.  LC encouraged STS and cue feedings ad lib and reviewed the special way STS can help stabilize both blood sugar and temperature of baby.  LC encouraged review of Baby and Me pp 9, 14 and 20-25 for STS and BF information. LC provided [employer] Resource brochure and reviewed [facility] services and list of community and web site resources. Mom states she knows how to hand express her colostrum and LC reviewed reasons to express milk.     Maternal Data Formula Feeding for Exclusion: No (baby had low blood sugar after initial breastfeeding and received formula per MD recommendation) Infant to breast within first hour of birth: Yes (initial LATCH score=8; baby breastfed 10 minutes) Has patient been taught Hand Expression?: Yes (mom states she knows how to express milk by hand) Does the patient have breastfeeding experience prior to this delivery?: Yes  Feeding Feeding Type: Formula (ok to give formula per mom) Length of feed: 10 min  LATCH Score/Interventions Latch: Grasps breast easily, tongue down, lips flanged, rhythmical sucking.  Audible Swallowing: None Intervention(s): Skin to skin  Type of Nipple: Everted at rest and after stimulation  Comfort (Breast/Nipple): Soft / non-tender     Hold (Positioning): No assistance needed to correctly position infant at breast.  LATCH Score: 8 (iitial breastfeeding, per RN  assessment)  Lactation Tools Discussed/Used   STS, hand expression, cue feedings  Consult Status Consult Status: Follow-up Date: 01/03/14 Follow-up type: In-patient    Zara ChessJoanne P Kairo Laubacher 01/02/2014, 5:59 PM

## 2014-01-02 NOTE — H&P (Signed)
Attestation of Attending Supervision of Fellow: Evaluation and management procedures were performed by the Fellow under my supervision and collaboration.  I have reviewed the Fellow's note and chart, and I agree with the management and plan.    

## 2014-01-02 NOTE — Progress Notes (Signed)
Pamela [name] is a 38 y.o. [id number] at 5761w1d admitted for active labor  Subjective: Pt is s/p epidural. Large amniotic sac with poor engagement on cervical exam. Pt is more comfortable with epidural but with persistent pain  Objective: BP 101/61  Pulse 78  Temp(Src) 97.7 F (36.5 C) (Oral)  Resp 20  Ht 5\' 7"  (1.702 m)  Wt 86.183 kg (190 lb)  BMI 29.75 kg/m2  SpO2 79%      FHT:  FHR: 150s bpm, variability: moderate,  accelerations:  Present,  decelerations:  Present occassional variable decel. one likely deep with quick recovery. difficulty tracing. UC:   regular, every 5 minutes SVE:   Dilation: 10 Effacement (%): 60 Station: -3 Exam by:: Dr. Ike Benedom Poor exam of cervix, large bag and unable to feel fetal head or cervix around bag.   Labs: Lab Results  Component Value Date   WBC 8.8 01/02/2014   HGB 10.5* 01/02/2014   HCT 32.7* 01/02/2014   MCV 81.3 01/02/2014   PLT 355 01/02/2014    Assessment / Plan: Spontaneous labor, progressing normally  Labor: Progressing normally and will monitor. consider AROM if no change. caution for cord.  Preeclampsia:  no signs or symptoms of toxicity Fetal Wellbeing:  Category II Pain Control:  Epidural I/D:  GBS + Ampicillin in. Continue1g q4h Anticipated MOD:  NSVD GDMA1: q2h FSG   Pamela BalsamMichael [name] Pamela [name] 01/02/2014, 12:15 PM

## 2014-01-02 NOTE — Progress Notes (Signed)
Pamela [name] is a 38 y.o. [id number] at 158w1d admitted for active labor  Subjective: Uncomfortable. Pt is s/p epidural.  Objective: BP 122/76  Pulse 77  Temp(Src) 97.7 F (36.5 C) (Oral)  Resp 20  Ht 5\' 7"  (1.702 m)  Wt 86.183 kg (190 lb)  BMI 29.75 kg/m2  SpO2 100%      FHT:  FHR: 120-130s bpm, variability: moderate,  Accelerations, good fetal scalp stim:  Present,  decelerations:  Present occassional variable decel, recent early decels with ctx UC:   regular, q 2-3 min SVE:   Dilation: 9.5 Effacement (%): 100 Station: +1 Exam by:: Dr. Ike Benedom  Palpable large fluid sac, essentially SROM with minimal manipulation @ 1355, clear fluid  Labs: Lab Results  Component Value Date   WBC 8.8 01/02/2014   HGB 10.5* 01/02/2014   HCT 32.7* 01/02/2014   MCV 81.3 01/02/2014   PLT 355 01/02/2014    Assessment / Plan: Spontaneous labor, progressing normally  Labor: Progressing normally. S/p SROM 1355 (slight manipulation, clear fluid, no cord pro-lapse). Anticipate delivery  Preeclampsia:  no signs or symptoms of toxicity Fetal Wellbeing:  Category II Pain Control:  Epidural I/D:  GBS + Ampicillin in. Continue1g q4h Anticipated MOD:  NSVD GDMA1: q2h FSG  Saralyn PilarAlexander Karamalegos, DO [facility], PGY-1 01/02/2014, 2:03 PM  I spoke with and examined patient and agree with resident's note and plan of care.  Tawana ScaleMichael Ryan Blessen Kimbrough, MD OB Fellow 01/02/2014 5:30 PM

## 2014-01-02 NOTE — Progress Notes (Signed)
UR completed 

## 2014-01-03 ENCOUNTER — Inpatient Hospital Stay (HOSPITAL_COMMUNITY): Admission: RE | Admit: 2014-01-03 | Payer: Medicaid Other | Source: Ambulatory Visit

## 2014-01-03 NOTE — Lactation Note (Signed)
This note was copied from the chart of Pamela [name]. Lactation Consultation Note Follow up consult:  Mother concerned that baby is jaundice and falls asleep too soon at the breast. Reviewed waking techniques and breast massage.  Encouraged mother to breastfeed baby STS.   Mom encouraged to feed baby 8-12 times/24 hours and with feeding cues.  Encouraged mother to call with next feeding if assistance is needed.     Patient Name: Pamela Viann ShoveCandice Fitzner EAVWU'JToday's Date: 01/03/2014 Reason for consult: Follow-up assessment   Maternal Data    Feeding Feeding Type: Breast Fed Length of feed: 10 min  LATCH Score/Interventions                      Lactation Tools Discussed/Used     Consult Status Consult Status: Follow-up Date: 01/04/14 Follow-up type: In-patient    [name] 01/03/2014, 2:43 PM

## 2014-01-03 NOTE — Progress Notes (Signed)
Post Partum Day #1 Subjective: Pt reports pain in her sacral region, is going to try heating pad. Lochia rubra decreasing in amount. Tolerating PO.  No chest pain or dyspnea.  Ambulating and voiding. Breastfeeding going well.   Objective: Blood pressure 102/68, pulse 76, temperature 98.9 F (37.2 C), temperature source Oral, resp. rate 18, height 5\' 7"  (1.702 m), weight 86.183 kg (190 lb), SpO2 98.00%, unknown if currently breastfeeding.  Physical Exam:  General: alert and mild distress Lochia: appropriate Uterine Fundus: firm, below the umbilicus Incision: NA DVT Evaluation: No evidence of DVT seen on physical exam. Negative Homan's sign.   Recent Labs  01/02/14 1050  HGB 10.5*  HCT 32.7*    Assessment/Plan: Pamela [name] is a 38 y.o. female G4 now 34P3013 s/p NSVD. Had been planned for induction for newly diagnosed GDM but SOL yesterday. 1. GDM: Glucose and vital signs stable. 2.Sacral pain: continue PO analgesia, heating pad and donut 3. Continue routine postpartum care. Likely d/c home tomorrow.     LOS: 1 day   Pamela JacobsonRobyn H [name] 01/03/2014, 6:45 AM   I have seen and examined this patient and agree with above documentation in the resident's note.   Rulon AbideKeli Jlynn Langille, M.D. [facility] Fellow 01/03/2014 10:19 AM

## 2014-01-04 MED ORDER — TETANUS-DIPHTH-ACELL PERTUSSIS 5-2.5-18.5 LF-MCG/0.5 IM SUSP
0.5000 mL | Freq: Once | INTRAMUSCULAR | Status: AC
  Administered 2014-01-04: 0.5 mL via INTRAMUSCULAR
  Filled 2014-01-04: qty 0.5

## 2014-01-04 MED ORDER — IBUPROFEN 600 MG PO TABS: 600.0000 mg | ORAL_TABLET | Freq: Four times a day (QID) | ORAL | Status: DC

## 2014-01-04 MED ORDER — NORETHINDRONE 0.35 MG PO TABS: ORAL_TABLET | ORAL | Status: DC

## 2014-01-04 MED ORDER — MEASLES, MUMPS & RUBELLA VAC ~~LOC~~ INJ
0.5000 mL | INJECTION | Freq: Once | SUBCUTANEOUS | Status: AC
  Administered 2014-01-04: 0.5 mL via SUBCUTANEOUS
  Filled 2014-01-04: qty 0.5

## 2014-01-04 NOTE — Discharge Instructions (Signed)

## 2014-01-04 NOTE — Discharge Summary (Signed)
Obstetric Discharge Summary Reason for Admission: onset of labor Prenatal Procedures: none Intrapartum Procedures: spontaneous vaginal delivery Postpartum Procedures: none Complications-Operative and Postpartum: none Hemoglobin  Date Value Ref Range Status  01/02/2014 10.5* 12.0 - 15.0 g/dL Final     HCT  Date Value Ref Range Status  01/02/2014 32.7* 36.0 - 46.0 % Final   Ms Colette RibasByrd is a 38yo [id number] admitted at 39.1wks in active labor on 4/30. She progressed within hours to SVD. (She has newly dx A2GDM and was scheduled for IOL on 5/1.) By PPD#2 she is doing well and is deemed to have received the full benefit of her hospital stay. She is breastfeeding and plans on Micronor for contraception.  Physical Exam:   General: alert, cooperative and no distress Heart: RRR Lungs: nl effort Lochia: appropriate Uterine Fundus: firm DVT Evaluation: No evidence of DVT seen on physical exam.  Discharge Diagnoses: Term Pregnancy-delivered  Discharge Information: Date: 01/04/2014 Activity: pelvic rest Diet: routine Medications: PNV, Ibuprofen and Micronor Condition: stable Instructions: refer to practice specific booklet Discharge to: home Follow-up Information   [facility] (Keep next scheduled appointment., For your postpartum appointment.)    Contact information:   [address] [place] [id number] [phone]      Newborn Data: Live born female  Birth Weight: 8 lb 0.9 oz (3655 g) APGAR: 9, 9  Home with mother.  Pamela [name] 01/04/2014, 7:22 AM

## 2014-01-04 NOTE — Discharge Summary (Signed)
Attestation of Attending Supervision of Advanced Practitioner (CNM/NP): Evaluation and management procedures were performed by the Advanced Practitioner under my supervision and collaboration.  I have reviewed the Advanced Practitioner's note and chart, and I agree with the management and plan.  Brighten Orndoff 01/04/2014 7:48 AM   

## 2014-01-30 ENCOUNTER — Ambulatory Visit (INDEPENDENT_AMBULATORY_CARE_PROVIDER_SITE_OTHER): Payer: 59 | Admitting: Family

## 2014-01-30 ENCOUNTER — Encounter: Payer: Self-pay | Admitting: Family

## 2014-01-30 NOTE — Progress Notes (Signed)
  Subjective:     Pamela [name] is a 38 y.o. female who presents for a postpartum visit. She is 4 weeks postpartum following a spontaneous vaginal delivery. I have fully reviewed the prenatal and intrapartum course. The delivery was at 39 gestational weeks. Outcome: spontaneous vaginal delivery. Anesthesia: epidural. Postpartum course has been unremakable. Baby's course has been unremarkable. Baby is feeding by breast. Bleeding no bleeding. Bleeding stopped one week ago.  Bowel function is normal. Bladder function is normal. Patient is not sexually active. Contraception method is Pharmacist, hospital. Postpartum depression screening: negative.  The following portions of the patient's history were reviewed and updated as appropriate: allergies, current medications, past family history, past medical history, past social history, past surgical history and problem list.  Review of Systems Pertinent items are noted in HPI.   Objective:    BP 121/79  Pulse 76  Temp(Src) 98.3 F (36.8 C) (Oral)  Ht 5\' 7"  (1.702 m)  Wt 175 lb 1.6 oz (79.425 kg)  BMI 27.42 kg/m2  Breastfeeding? Yes        General:  alert, cooperative and appears stated age   Breasts:  inspection negative, no nipple discharge or bleeding, no masses or nodularity palpable  Lungs: clear to auscultation bilaterally  Heart:  regular rate and rhythm, S1, S2 normal, no murmur, click, rub or gallop  Abdomen: soft, non-tender; bowel sounds normal; no masses,  no organomegaly  Pelvic exam not indicated - not bleeding, no pain at vaginal area.   Assessment:     Normal postpartum exam. Pap smear not done at today's visit.  last Pap in December 2014 - normal.    Plan:    1. Contraception: Micronor.  Explained the decreased effectiveness.  Pt may get Mirena.   2.  Follow up in: 2-4 weeks if desires Mirena or as needed.   Eino Farber Paul Half, CNM

## 2014-02-19 ENCOUNTER — Encounter: Payer: Self-pay | Admitting: Family Medicine

## 2014-02-19 ENCOUNTER — Ambulatory Visit (INDEPENDENT_AMBULATORY_CARE_PROVIDER_SITE_OTHER): Payer: Medicaid Other | Admitting: Family Medicine

## 2014-02-19 VITALS — BP 120/70 | Temp 98.0°F | Wt 173.0 lb

## 2014-02-19 DIAGNOSIS — J209 Acute bronchitis, unspecified: Secondary | ICD-10-CM

## 2014-02-19 MED ORDER — AMOXICILLIN 875 MG PO TABS: 875.0000 mg | ORAL_TABLET | Freq: Two times a day (BID) | ORAL | Status: DC

## 2014-02-19 NOTE — Patient Instructions (Signed)
Take all the medicine and if not totally back to normal when you finish give me a call

## 2014-02-19 NOTE — Progress Notes (Signed)
   Subjective:    Patient ID: Pamela [name], female    DOB: May 18, 1976, 38 y.o.   MRN: [id number]  HPI Approximately 2 weeks ago she had difficulty with sneezing, cough, runny nose, earache. The coughing got worse associated with fever and chills. She is now having a productive cough.. She is breast-feeding She does not smoke Review of Systems     Objective:   Physical Exam alert and in no distress. Tympanic membranes and canals are normal. Throat is clear. Tonsils are normal. Neck is supple without adenopathy or thyromegaly. Cardiac exam shows a regular sinus rhythm without murmurs or gallops. Lungs are clear to auscultation.        Assessment & Plan:  Acute bronchitis - Plan: amoxicillin (AMOXIL) 875 MG tablet  a cough not entirely better when she finishes the course the antibiotic. Also discussed the small amount of antibiotic get across to the breast milk. She is comfortable with this.

## 2014-02-20 ENCOUNTER — Ambulatory Visit: Payer: Medicaid Other | Admitting: Obstetrics and Gynecology

## 2014-04-15 ENCOUNTER — Emergency Department (HOSPITAL_COMMUNITY): Payer: Medicaid Other

## 2014-04-15 ENCOUNTER — Emergency Department (HOSPITAL_COMMUNITY)
Admission: EM | Admit: 2014-04-15 | Discharge: 2014-04-16 | Disposition: A | Discharge status: Home / Self Care | Payer: Medicaid Other | Attending: Emergency Medicine | Admitting: Emergency Medicine

## 2014-04-15 ENCOUNTER — Encounter (HOSPITAL_COMMUNITY): Payer: Self-pay | Admitting: Emergency Medicine

## 2014-04-15 DIAGNOSIS — Z87891 Personal history of nicotine dependence: Secondary | ICD-10-CM | POA: Insufficient documentation

## 2014-04-15 DIAGNOSIS — J020 Streptococcal pharyngitis: Secondary | ICD-10-CM | POA: Insufficient documentation

## 2014-04-15 DIAGNOSIS — E119 Type 2 diabetes mellitus without complications: Secondary | ICD-10-CM | POA: Insufficient documentation

## 2014-04-15 DIAGNOSIS — J36 Peritonsillar abscess: Secondary | ICD-10-CM | POA: Insufficient documentation

## 2014-04-15 DIAGNOSIS — J029 Acute pharyngitis, unspecified: Secondary | ICD-10-CM | POA: Insufficient documentation

## 2014-04-15 DIAGNOSIS — H6690 Otitis media, unspecified, unspecified ear: Secondary | ICD-10-CM | POA: Insufficient documentation

## 2014-04-15 DIAGNOSIS — Z3202 Encounter for pregnancy test, result negative: Secondary | ICD-10-CM | POA: Insufficient documentation

## 2014-04-15 DIAGNOSIS — L0291 Cutaneous abscess, unspecified: Secondary | ICD-10-CM

## 2014-04-15 HISTORY — DX: Chronic sinusitis, unspecified: J32.9

## 2014-04-15 LAB — CBC WITH DIFFERENTIAL/PLATELET
Basophils Absolute: 0 10*3/uL (ref 0.0–0.1)
Basophils Relative: 0 % (ref 0–1)
EOS ABS: 0 10*3/uL (ref 0.0–0.7)
Eosinophils Relative: 0 % (ref 0–5)
HCT: 36.3 % (ref 36.0–46.0)
Hemoglobin: 12 g/dL (ref 12.0–15.0)
LYMPHS ABS: 1.7 10*3/uL (ref 0.7–4.0)
Lymphocytes Relative: 9 % — ABNORMAL LOW (ref 12–46)
MCH: 27 pg (ref 26.0–34.0)
MCHC: 33.1 g/dL (ref 30.0–36.0)
MCV: 81.8 fL (ref 78.0–100.0)
Monocytes Absolute: 2.3 10*3/uL — ABNORMAL HIGH (ref 0.1–1.0)
Monocytes Relative: 13 % — ABNORMAL HIGH (ref 3–12)
NEUTROS ABS: 14.3 10*3/uL — AB (ref 1.7–7.7)
NEUTROS PCT: 78 % — AB (ref 43–77)
PLATELETS: 262 10*3/uL (ref 150–400)
RBC: 4.44 MIL/uL (ref 3.87–5.11)
RDW: 15.2 % (ref 11.5–15.5)
WBC: 18.3 10*3/uL — AB (ref 4.0–10.5)

## 2014-04-15 LAB — BASIC METABOLIC PANEL
Anion gap: 18 — ABNORMAL HIGH (ref 5–15)
BUN: 7 mg/dL (ref 6–23)
CO2: 22 mEq/L (ref 19–32)
Calcium: 9.5 mg/dL (ref 8.4–10.5)
Chloride: 99 mEq/L (ref 96–112)
Creatinine, Ser: 0.7 mg/dL (ref 0.50–1.10)
GFR calc Af Amer: >90 mL/min (ref >90)
GLUCOSE: 117 mg/dL — AB (ref 70–99)
Potassium: 3.3 mEq/L — ABNORMAL LOW (ref 3.7–5.3)
SODIUM: 139 meq/L (ref 137–147)

## 2014-04-15 LAB — RAPID STREP SCREEN (MED CTR MEBANE ONLY): Streptococcus, Group A Screen (Direct): POSITIVE — AB

## 2014-04-15 LAB — POC URINE PREG, ED: PREG TEST UR: NEGATIVE

## 2014-04-15 MED ORDER — IOHEXOL 300 MG/ML  SOLN
80.0000 mL | Freq: Once | INTRAMUSCULAR | Status: AC | PRN
  Administered 2014-04-15: 80 mL via INTRAVENOUS

## 2014-04-15 MED ORDER — ACETAMINOPHEN 325 MG PO TABS
650.0000 mg | ORAL_TABLET | Freq: Once | ORAL | Status: AC
  Administered 2014-04-15: 650 mg via ORAL
  Filled 2014-04-15: qty 2

## 2014-04-15 MED ORDER — ACETAMINOPHEN 325 MG PO TABS: 650.0000 mg | ORAL_TABLET | Freq: Once | ORAL | Status: DC

## 2014-04-15 NOTE — ED Provider Notes (Signed)
CSN: [id number]     Arrival date & time 04/15/14  1810 History   First MD Initiated Contact with Patient 04/15/14 2006     Chief Complaint  Patient presents with  . Sore Throat  . Generalized Body Aches  . Otalgia     (Consider location/radiation/quality/duration/timing/severity/associated sxs/prior Treatment) Patient is a 38 y.o. female presenting with pharyngitis.  Sore Throat This is a new problem. The current episode started 2 days ago. The problem occurs constantly. The problem has been gradually worsening. Pertinent negatives include no chest pain, no abdominal pain, no headaches and no shortness of breath. Nothing aggravates the symptoms. Nothing relieves the symptoms.    Past Medical History  Diagnosis Date  . Allergy     RHINITIS  . Thyroid disease     THYROID CYST  . Diabetes mellitus   . Abnormal Pap smear   . Postpartum care following vaginal delivery (08/04/11) 08/05/2011  . History of gestational diabetes in prior pregnancy, currently pregnant in second trimester 08/14/2013    A2 GDM x2 on glyburide with both, normal testing after pregnancy, both AGA Clinic: [facility] Genetic Screen Quad nl  Anatomic [place] Normal but needs F/U anatomy 4-6 wks> nl  Glucose Screen  early screen  11/25/2013 157 11/29/2013 Abnl 3 hour GTT 97/154/168/125  GBS pos  Feeding Preference breast  Contraception OCP's (undecided)  Circumcision Yes   Flu Vaccine: 08/14/2013 TDaP:11/25/2013   . Gestational diabetes     diet controlled  . Vaginal Pap smear, abnormal   . Otitis media   . Recurrent sinus infections    Past Surgical History  Procedure Laterality Date  . Colposcopy  2004   Family History  Problem Relation Age of Onset  . Diabetes Father   . Heart disease Father   . Hypertension Father   . Prostate cancer Father   . Stroke Father   . Asthma Sister   . Hypertension Sister   . Arthritis Paternal Grandmother   . Hypertension Paternal Grandmother   . Diabetes Paternal Grandmother   .  Stroke Paternal Grandmother   . Arthritis Paternal Grandfather   . Hypertension Paternal Grandfather   . Multiple sclerosis Mother    History  Substance Use Topics  . Smoking status: Former Smoker    Types: Cigars    Quit date: 09/05/2006  . Smokeless tobacco: Never Used     Comment: smoked socially for a few yrs  . Alcohol Use: No   OB History   Grav Para Term Preterm Abortions TAB SAB Ect Mult Living   4 3 3  1 1    3      Review of Systems  Constitutional: Negative for fever and chills.  HENT: Negative for congestion, rhinorrhea and sore throat.   Eyes: Negative for photophobia and visual disturbance.  Respiratory: Negative for cough and shortness of breath.   Cardiovascular: Negative for chest pain and leg swelling.  Gastrointestinal: Negative for nausea, vomiting, abdominal pain, diarrhea and constipation.  Endocrine: Negative for polyphagia and polyuria.  Genitourinary: Negative for dysuria, flank pain, vaginal bleeding, vaginal discharge and enuresis.  Musculoskeletal: Negative for back pain and gait problem.  Skin: Negative for color change and rash.  Neurological: Negative for dizziness, syncope, light-headedness, numbness and headaches.  Hematological: Negative for adenopathy. Does not bruise/bleed easily.  All other systems reviewed and are negative.     Allergies  Cephalexin  Home Medications   Prior to Admission medications   Medication Sig Start Date End Date Taking?  Authorizing Provider  ibuprofen (ADVIL,MOTRIN) 200 MG tablet Take 200 mg by mouth every 6 (six) hours as needed for moderate pain.   Yes Historical Provider, MD  ibuprofen (ADVIL,MOTRIN) 600 MG tablet Take 600 mg by mouth every 4 (four) hours as needed for moderate pain.   Yes Historical Provider, MD  phenol (CHLORASEPTIC) 1.4 % LIQD Use as directed 2 sprays in the mouth or throat every 3 (three) hours.   Yes Historical Provider, MD   BP 122/58  Pulse 120  Temp(Src) 100.4 F (38 C) (Oral)   Resp 19  Ht 5\' 7"  (1.702 m)  Wt 184 lb (83.462 kg)  BMI 28.81 kg/m2  SpO2 97% Physical Exam  Vitals reviewed. Constitutional: She is oriented to person, place, and time. She appears well-developed and well-nourished.  HENT:  Head: Normocephalic and atraumatic.  Right Ear: External ear normal. Tympanic membrane is bulging.  Left Ear: Tympanic membrane and external ear normal.  Mouth/Throat: Oropharyngeal exudate, posterior oropharyngeal edema and posterior oropharyngeal erythema present.  Eyes: Conjunctivae and EOM are normal. Pupils are equal, round, and reactive to light.  Neck: Normal range of motion. Neck supple.  Cardiovascular: Normal rate, regular rhythm, normal heart sounds and intact distal pulses.   Pulmonary/Chest: Effort normal and breath sounds normal.  Abdominal: Soft. Bowel sounds are normal. There is no tenderness.  Musculoskeletal: Normal range of motion.  Neurological: She is alert and oriented to person, place, and time.  Skin: Skin is warm and dry.    ED Course  Procedures (including critical care time) Labs Review Labs Reviewed  RAPID STREP SCREEN  CBC WITH DIFFERENTIAL  BASIC METABOLIC PANEL    Imaging Review No results found.   EKG Interpretation None      MDM   Final diagnoses:  None    38 y.o. female  with pertinent PMH of prior strep pharyngitis presents with recurrent sore throat, fever, muffled voice, and right ear pain. Patient was in her normal state of health until approximately 3 days ago she began to have a sore throat, followed by onset of her symptoms. She's been febrile to 100.4 prior to arrival.  She does feel as if her voice were muffled.  Physical exam demonstrated bilateral tonsillomegaly, and purulence.  No asymmetry appreciated on exam.  Patient also tenderness over the right mastoid. She had signs of acute otitis media on that side. Given muffled voice and trismus, CT scan obtained which demonstrated 1.7 cm peritonsillar abscess  on the right. Consult ENT.  They recommended amox, pain medication, and will fu in 2-3 days.  Pt given standard return precautions, voiced understanding, and agreed to fu.    Labs and imaging as above reviewed.   1. Abscess   2. Strep pharyngitis         Mirian MoMatthew Graysin Luczynski, MD 04/16/14 [phone]

## 2014-04-15 NOTE — ED Notes (Signed)
Pt states that she began having a sore throat, ear pain, and fever since Sunday. Pt states she has frequent ear and throat infections. Pt is having difficulty swallowing and cannot fully open her mouth d/t pain in her ears. Pt's voice sounds congested and throat is bright red upon assessment. Pt is also tachycardic at 120.

## 2014-04-16 ENCOUNTER — Ambulatory Visit: Payer: Medicaid Other | Admitting: Family Medicine

## 2014-04-16 ENCOUNTER — Encounter (HOSPITAL_COMMUNITY): Payer: Self-pay | Admitting: Emergency Medicine

## 2014-04-16 ENCOUNTER — Telehealth (HOSPITAL_BASED_OUTPATIENT_CLINIC_OR_DEPARTMENT_OTHER): Payer: Self-pay | Admitting: Emergency Medicine

## 2014-04-16 ENCOUNTER — Emergency Department (HOSPITAL_COMMUNITY)
Admission: EM | Admit: 2014-04-16 | Discharge: 2014-04-16 | Disposition: A | Discharge status: Home / Self Care | Payer: Medicaid Other | Attending: Emergency Medicine | Admitting: Emergency Medicine

## 2014-04-16 DIAGNOSIS — Z87891 Personal history of nicotine dependence: Secondary | ICD-10-CM | POA: Insufficient documentation

## 2014-04-16 DIAGNOSIS — J36 Peritonsillar abscess: Secondary | ICD-10-CM | POA: Insufficient documentation

## 2014-04-16 DIAGNOSIS — R609 Edema, unspecified: Secondary | ICD-10-CM | POA: Insufficient documentation

## 2014-04-16 DIAGNOSIS — J029 Acute pharyngitis, unspecified: Secondary | ICD-10-CM | POA: Insufficient documentation

## 2014-04-16 DIAGNOSIS — H7390 Unspecified disorder of tympanic membrane, unspecified ear: Secondary | ICD-10-CM | POA: Insufficient documentation

## 2014-04-16 DIAGNOSIS — J020 Streptococcal pharyngitis: Secondary | ICD-10-CM

## 2014-04-16 DIAGNOSIS — R000 Tachycardia, unspecified: Secondary | ICD-10-CM | POA: Insufficient documentation

## 2014-04-16 MED ORDER — ACETAMINOPHEN-CODEINE 120-12 MG/5ML PO SUSP: 10.0000 mL | Freq: Four times a day (QID) | ORAL | Status: DC | PRN

## 2014-04-16 MED ORDER — SODIUM CHLORIDE 0.9 % IV SOLN
Freq: Once | INTRAVENOUS | Status: AC
  Administered 2014-04-16: 16:00:00 via INTRAVENOUS

## 2014-04-16 MED ORDER — CLINDAMYCIN PHOSPHATE 600 MG/50ML IV SOLN
600.0000 mg | Freq: Once | INTRAVENOUS | Status: AC
  Administered 2014-04-16: 600 mg via INTRAVENOUS
  Filled 2014-04-16: qty 50

## 2014-04-16 MED ORDER — HYDROCODONE-ACETAMINOPHEN 7.5-325 MG/15ML PO SOLN: 15.0000 mL | Freq: Three times a day (TID) | ORAL | Status: DC | PRN

## 2014-04-16 MED ORDER — SODIUM CHLORIDE 0.9 % IV BOLUS (SEPSIS)
1000.0000 mL | Freq: Once | INTRAVENOUS | Status: AC
  Administered 2014-04-16: 1000 mL via INTRAVENOUS

## 2014-04-16 MED ORDER — ACETAMINOPHEN 160 MG/5ML PO SOLN
500.0000 mg | Freq: Once | ORAL | Status: AC
  Administered 2014-04-16: 500 mg via ORAL
  Filled 2014-04-16: qty 20

## 2014-04-16 MED ORDER — CLINDAMYCIN HCL 300 MG PO CAPS: 300.0000 mg | ORAL_CAPSULE | Freq: Three times a day (TID) | ORAL | Status: AC

## 2014-04-16 MED ORDER — HYDROMORPHONE HCL PF 1 MG/ML IJ SOLN
INTRAMUSCULAR | Status: AC
  Filled 2014-04-16: qty 1

## 2014-04-16 MED ORDER — DEXAMETHASONE SODIUM PHOSPHATE 10 MG/ML IJ SOLN
10.0000 mg | Freq: Once | INTRAMUSCULAR | Status: AC
  Administered 2014-04-16: 10 mg via INTRAVENOUS
  Filled 2014-04-16: qty 1

## 2014-04-16 NOTE — Progress Notes (Signed)
                  Dear _____[name] ______________________:  Pamela QuinYou have been approved to have your discharge prescriptions filled through our [facility] (Medication [facility] Through [facility]) program. This program allows for a one-time (no refills) 34-day supply of selected medications for a low copay amount.  The copay is $3.00 per prescription. For instance, if you have one prescription, you will pay $3.00; for two prescriptions, you pay $6.00; for three prescriptions, you pay $9.00; and so on.  Only certain pharmacies are participating in this program with [facility]. You will need to select one of the pharmacies from the attached list and take your prescriptions, this letter, and your photo ID to one of the participating pharmacies.   We are excited that you are able to use the [facility] program to get your medications. These prescriptions must be filled within 7 days of hospital discharge or they will no longer be valid for the [facility] program. Should you have any problems with your prescriptions please contact your case management team member at [phone].  Thank you,   [facility]   Participating [facility] Pharmacies  [facility]   [facility] [address] [place], [place]   [facility] [address] [place]   [facility] [address], [address] [place], [place]   [facility]   [address], [place], [place]   [id number], [place], [place]   [address], [place], [place]   [id number] [address], [place], [place]   [id number], [place], [place]   [id number], [place], [place]   [address], [place], [place]   [address], [place], [place]   [address], [place], [place]   [id number] [place], [place], [place]  [facility]   [address], [place], [place]   [id number] [place],  [place]   [id number], [place], [place]   [id number] [address], [place], [place]   [address], [place], [place]   [id number] [place] [address], [place], [place] [facility]   [address], [place], [place]   [address], [place], [place]   [id number] [address], [place], [place]   [address], [place], [place]   [address], [place], [place]   [address], [place], [place]   [address], [place], [place]   [address], [place], [place]   [id number] [address], [place], [place]   [address], [place], [place]   [address], [place], [place]   [address], [place], [place]   [address], [place], [place]   [id number] [place], [place], [place]  Independent Pharmacies   [facility] [address], [address] [place], [place]   [facility] [address] [place], [place]   [facility] [address] [place], [place]   For continued medication needs, please contact the [facility] Department at  [phone].

## 2014-04-16 NOTE — ED Provider Notes (Signed)
I have personally seen and examined the patient.  I have discussed the plan of care with the resident.  I have reviewed the documentation on PMH/FH/Soc. History.  I have reviewed the documentation of the resident and agree.   Joya Gaskinsonald W Rani Idler, MD 04/16/14 [phone]

## 2014-04-16 NOTE — ED Provider Notes (Signed)
6:23 PM Pt has tolerated hot tea and liquid tylenol. She still has sore throat, but feels somewhat better. WIll d/c home with T3 instead of hycet due to cost as well as PO clinda.  Strict return precautions givne for worsening symptoms including inability to tolerate liquids. She will f/u with ENT tomorrow.   1. Peritonsillar abscess   2. Strep pharyngitis      Toy CookeyMegan Natanael Saladin, MD 04/16/14 1902

## 2014-04-16 NOTE — Progress Notes (Addendum)
     CARE MANAGEMENT ED NOTE 04/16/2014  Patient:  Pamela [name],Pamela [name]   Account Number:  [id number]  Date Initiated:  04/16/2014  Documentation initiated by:  Edd ArbourGIBBS,Giovanny Dugal  Subjective/Objective Assessment:   38 yr old female here 04/15/14 diagnosed with strep pharyngitis and peritonsillar abscess . Pt states now she is having [name] hard time breathing. NAD. Pt states she has not had [name] chance to get meds (antibiotic amoxicillin) filled     Subjective/Objective Assessment Detail:   Allergies: Cephalexin listed in EPIC  Pt reports having family planning medicaid that "is suppose to roll over to regular medicaid but they tell me here that it is not active.  I have spoken with my worker and she said nothing about my medicaid not being active"     Action/Plan:   ED CM noted pt [facility] in last 2 days 1736 spoke with EDP, Micheline Mazeocherty about pt concern with medicaid access not verified as active   Action/Plan Detail:   see notes below  1800 Spoke with EDP about changing hycet to another liquid pain med To be changed to Tylenol with codeine so pt can afford  Pt given Coupons for [employer] & [employer] for lower cost of $6.82 & $6.92   Anticipated DC Date:  04/16/2014     Status Recommendation to Physician:   Result of Recommendation:    Other ED Services  Consult Working Plan    DC Planning Services  Outpatient Services - Pt will follow up  Other  PCP issues  Medication Assistance  MATCH Program    Choice offered to / List presented to:            Status of service:  Completed, signed off  ED Comments:   ED Comments Detail:  CM reviewed EPIC notes and chart review information CM spoke with the pt about [facility] MATCH program ($3 co pay for each Rx through [facility], does not include refills, 7 day expiration of MATCH letter and choice of pharmacies) Pt agreed to receive assistance from program CM spoke with pt who confirms self pay [place] resident with no pcp. CM  discussed and provided written information for self pay pcps, importance of pcp for f/u care, www.needymeds.org, discounted pharmacies and other [place] resources such as financial assistance, DSS and  health department  Reviewed resources for [place] self pay pcps like [facility] [place], family medicine at [address], [facility], general medical clinics, [facility] urgent care plus others, [facility] out patient pharmacies, housing, and other resources in [place]. Pt voiced understanding and appreciation of resources provided  Pt is eligible for [facility] MATCH program (unable to find pt listed in PDMI per cardholder name inquiry) CM reviewed EPIC notes and chart review information CM spoke with the pt about [facility] MATCH program ($3 co pay for each Rx through [facility] program, does not include refills, 7 day expiration of MATCH letter and choice of pharmacies) Pt agreed to receive assistance from program PDMI information entered. MATCH letter completed and provided to pt. CM updated EDP

## 2014-04-16 NOTE — ED Provider Notes (Signed)
CSN: [id number]     Arrival date & time 04/16/14  1301 History   First MD Initiated Contact with Patient 04/16/14 1325     Chief Complaint  Patient presents with  . Sore Throat   Patient is a 38 y.o. female presenting with pharyngitis.  Sore Throat Associated symptoms include a sore throat. Pertinent negatives include no abdominal pain, arthralgias, chills, fever, numbness or rash.   [name] is a 38 yo woman with a history of strep pharyngitis, recurrent sinus infections and a thyroid cyst who was in the ED yesterday for strep pharyngitis with a peritonsillar abscess on the right (on CT). She was seen by ENT and received Omnipaque in the ED and was given a prescription for amoxicillin, which she has not yet filled. She felt good at the time of discharge, but then woke up last night with a racing heart, some disorientation, strange dreams and rapid breathing. When these symptoms did not subside this morning, she decided to come into the ED again.   Past Medical History  Diagnosis Date  . Allergy     RHINITIS  . Thyroid disease     THYROID CYST  . Diabetes mellitus   . Abnormal Pap smear   . Postpartum care following vaginal delivery (08/04/11) 08/05/2011  . History of gestational diabetes in prior pregnancy, currently pregnant in second trimester 08/14/2013    A2 GDM x2 on glyburide with both, normal testing after pregnancy, both AGA [facility] Quad nl  Anatomic US Normal but needs F/U anatomy 4-6 wks> nl  Glucose Screen  early screen  11/25/2013 157 11/29/2013 Abnl 3 hour GTT 97/154/168/125  GBS pos  Feeding Preference breast  Contraception OCP's (undecided)  Circumcision Yes   Flu Vaccine: 08/14/2013 TDaP:11/25/2013   . Gestational diabetes     diet controlled  . Vaginal Pap smear, abnormal   . Otitis media   . Recurrent sinus infections    Past Surgical History  Procedure Laterality Date  . Colposcopy  2004   Family History  Problem Relation Age of Onset  .  Diabetes Father   . Heart disease Father   . Hypertension Father   . Prostate cancer Father   . Stroke Father   . Asthma Sister   . Hypertension Sister   . Arthritis Paternal Grandmother   . Hypertension Paternal Grandmother   . Diabetes Paternal Grandmother   . Stroke Paternal Grandmother   . Arthritis Paternal Grandfather   . Hypertension Paternal Grandfather   . Multiple sclerosis Mother    History  Substance Use Topics  . Smoking status: Former Smoker    Types: Cigars    Quit date: 09/05/2006  . Smokeless tobacco: Never Used     Comment: smoked socially for a few yrs  . Alcohol Use: No   OB History   Grav Para Term Preterm Abortions TAB SAB Ect Mult Living   4 3 3  1 1    3      Review of Systems  Constitutional: Negative for fever, chills and activity change.  HENT: Positive for drooling, ear pain, sore throat and trouble swallowing.        Some left-sided facial edema  Eyes: Negative for discharge.  Respiratory: Negative for wheezing.        Rapid breathing overnight  Cardiovascular:       Rapid heartbeat overnight  Gastrointestinal: Negative for abdominal pain.  Musculoskeletal: Negative for arthralgias.  Skin: Negative for rash and wound.  Neurological: Negative for dizziness and numbness.  All other systems reviewed and are negative.     Allergies  Cephalexin  Home Medications   Prior to Admission medications   Medication Sig Start Date End Date Taking? Authorizing Provider  ibuprofen (ADVIL,MOTRIN) 200 MG tablet Take 200 mg by mouth every 6 (six) hours as needed for moderate pain.    Historical Provider, MD  ibuprofen (ADVIL,MOTRIN) 600 MG tablet Take 600 mg by mouth every 4 (four) hours as needed for moderate pain.    Historical Provider, MD  phenol (CHLORASEPTIC) 1.4 % LIQD Use as directed 2 sprays in the mouth or throat every 3 (three) hours.    Historical Provider, MD   BP 100/77  Pulse 107  Temp(Src) 100.1 F (37.8 C) (Oral)  Resp 20  SpO2  98% Physical Exam  Constitutional: She is oriented to person, place, and time. She appears well-developed and well-nourished.  HENT:  Head: Normocephalic.  Right Ear: External ear normal.  Left Ear: External ear normal.  Right tympanic membrane bulging, nonerythematous  Posterior oropharyngeal edema and exudate  Uvula is midline  Eyes: Conjunctivae and EOM are normal. Pupils are equal, round, and reactive to light.  Neck: Normal range of motion. Neck supple.  Cardiovascular: Normal rate, regular rhythm and normal heart sounds.   Pulmonary/Chest: Effort normal and breath sounds normal.  Abdominal: Soft. Bowel sounds are normal.  Musculoskeletal: Normal range of motion.  Neurological: She is alert and oriented to person, place, and time.  Skin: Skin is warm and dry.    ED Course  Procedures (including critical care time) Labs Review Labs Reviewed - No data to display  Imaging Review Dg Chest 2 View  04/15/2014   CLINICAL DATA:  Sore throat, fever and generalized body aches.  EXAM: CHEST  2 VIEW  COMPARISON:  Chest radiograph performed 07/01/2011  FINDINGS: The lungs are well-aerated. Mild blunting of the left costophrenic angle may reflect chronic pleural thickening. There is no evidence of focal opacification, pleural effusion or pneumothorax.  The heart is normal in size; the mediastinal contour is within normal limits. No acute osseous abnormalities are seen.  IMPRESSION: No acute cardiopulmonary process seen.   Electronically Signed   By: Roanna Raider M.D.   On: 04/15/2014 21:56   Ct Soft Tissue Neck W Contrast  04/16/2014   CLINICAL DATA:  Sore throat, ear pain and fever. Difficulty swallowing.  EXAM: CT NECK WITH CONTRAST  TECHNIQUE: Multidetector CT imaging of the neck was performed using the standard protocol following the bolus administration of intravenous contrast.  CONTRAST:  80mL OMNIPAQUE IOHEXOL 300 MG/ML  SOLN  COMPARISON:  CT of the head performed 10/11/2007   FINDINGS: There appears to be a poorly characterized evolving small abscess at the right palatine tonsil, measuring approximately 2.1 x 1.8 x 0.9 cm. This is difficult to fully define, with diffuse soft tissue edema involving the right palatine tonsil and extending inferiorly along the right side of the hypopharynx. There is haziness within the right parapharyngeal fat planes, and mild soft tissue inflammation extends about the right parotid and submandibular glands.  There is resultant narrowing of the oropharynx and upper hypopharynx. The valleculae and piriform sinuses are grossly unremarkable in appearance. There is no definite evidence of vascular compromise. Scattered bilateral cervical nodes are mildly enlarged, measuring up to 1.3 cm in size.  No acute osseous abnormalities are seen. Mild reversal of the normal lordotic curvature of the cervical spine may be positional in nature. Prevertebral  soft tissues are within normal limits. The thyroid gland is unremarkable. The visualized lung apices are clear.  The visualized paranasal sinuses and mastoid air cells are well aerated. The visualized portions of the brain are grossly unremarkable.  IMPRESSION: 1. Poorly characterized evolving small abscess at the right palatine tonsil, measuring 2.1 x 1.8 x 0.9 cm. 2. Diffuse soft tissue edema involves the right palatine tonsil, extending inferiorly along the right side of the hypopharynx. Haziness involving the right parapharyngeal fat planes, with mild soft tissue inflammation about the right parotid and submandibular glands. 3. Resultant narrowing of the oropharynx and upper hypopharynx. 4. Mild bilateral cervical lymphadenopathy noted.   Electronically Signed   By: Roanna Raider M.D.   On: 04/16/2014 00:54     EKG Interpretation None      MDM   Final diagnoses:  None    Pamela [name] is a 38 yo woman with strep pharyngitis and peritonsillar abscess who is here for a second day in a row with continuing  symptoms along with new onset of fast heart beat and fast breathing overnight. It is important to get her started on her course of antibiotics and to manage her edema. She will be started on IV clindamycin and dexamethasone to those ends.    Dionne Ano, MD 04/16/14 [phone]

## 2014-04-16 NOTE — Discharge Instructions (Signed)
Strep Throat °Strep throat is an infection of the throat caused by a bacteria named Streptococcus pyogenes. Your health care provider may call the infection streptococcal "tonsillitis" or "pharyngitis" depending on whether there are signs of inflammation in the tonsils or back of the throat. Strep throat is most common in children aged 36-15 years during the cold months of the year, but it can occur in people of any age during any season. This infection is spread from person to person (contagious) through coughing, sneezing, or other close contact. °SIGNS AND SYMPTOMS  °· Fever or chills. °· Painful, swollen, red tonsils or throat. °· Pain or difficulty when swallowing. °· White or yellow spots on the tonsils or throat. °· Swollen, tender lymph nodes or "glands" of the neck or under the jaw. °· Red rash all over the body (rare). °DIAGNOSIS  °Many different infections can cause the same symptoms. A test must be done to confirm the diagnosis so the right treatment can be given. A "rapid strep test" can help your health care provider make the diagnosis in a few minutes. If this test is not available, a light swab of the infected area can be used for a throat culture test. If a throat culture test is done, results are usually available in a day or two. °TREATMENT  °Strep throat is treated with antibiotic medicine. °HOME CARE INSTRUCTIONS  °· Gargle with 1 tsp of salt in 1 cup of warm water, 3-4 times per day or as needed for comfort. °· Family members who also have a sore throat or fever should be tested for strep throat and treated with antibiotics if they have the strep infection. °· Make sure everyone in your household washes their hands well. °· Do not share food, drinking cups, or personal items that could cause the infection to spread to others. °· You may need to eat a soft food diet until your sore throat gets better. °· Drink enough water and fluids to keep your urine clear or pale yellow. This will help prevent  dehydration. °· Get plenty of rest. °· Stay home from school, day care, or work until you have been on antibiotics for 24 hours. °· Take medicines only as directed by your health care provider. °· Take your antibiotic medicine as directed by your health care provider. Finish it even if you start to feel better. °SEEK MEDICAL CARE IF:  °· The glands in your neck continue to enlarge. °· You develop a rash, cough, or earache. °· You cough up green, yellow-brown, or bloody sputum. °· You have pain or discomfort not controlled by medicines. °· Your problems seem to be getting worse rather than better. °· You have a fever. °SEEK IMMEDIATE MEDICAL CARE IF:  °· You develop any new symptoms such as vomiting, severe headache, stiff or painful neck, chest pain, shortness of breath, or trouble swallowing. °· You develop severe throat pain, drooling, or changes in your voice. °· You develop swelling of the neck, or the skin on the neck becomes red and tender. °· You develop signs of dehydration, such as fatigue, dry mouth, and decreased urination. °· You become increasingly sleepy, or you cannot wake up completely. °MAKE SURE YOU: °· Understand these instructions. °· Will watch your condition. °· Will get help right away if you are not doing well or get worse. °Document Released: 08/19/2000 Document Revised: 01/06/2014 Document Reviewed: 10/21/2010 °ExitCare® Patient Information ©2015 [employer]. This information is not intended to replace advice given to you by   your health care provider. Make sure you discuss any questions you have with your health care provider. Peritonsillar Abscess A peritonsillar abscess is a collection of pus located in the back of the throat behind the tonsils. It usually occurs when a streptococcal infection of the throat or tonsils spreads into the space around the tonsils. They are almost always caused by the streptococcal germ (bacteria). The treatment of a peritonsillar abscess is most often  drainage accomplished by putting a needle into the abscess or cutting (incising) and draining the abscess. This is most often followed with a course of antibiotics. HOME CARE INSTRUCTIONS  If your abscess was drained by your caregiver today, rinse your throat (gargle) with warm salt water four times per day or as needed for comfort. Do not swallow this mixture. Mix 1 teaspoon of salt in 8 ounces of warm water for gargling.  Rest in bed as needed. Resume activities as able.  Apply cold to your neck for pain relief. Fill a plastic bag with ice and wrap it in a towel. Hold the ice on your neck for 20 minutes 4 times per day.  Eat a soft or liquid diet as tolerated while your throat remains sore. Popsicles and ice cream may be good early choices. Drinking plenty of cold fluids will probably be soothing and help take swelling down in between the warm gargles.  Only take over-the-counter or prescription medicines for pain, discomfort, or fever as directed by your caregiver. Do not use aspirin unless directed by your physician. Aspirin slows down the clotting process. It can also cause bleeding from the drainage area if this was needled or incised today.  If antibiotics were prescribed, take them as directed for the full course of the prescription. Even if you feel you are well, you need to take them. SEEK MEDICAL CARE IF:   You have increased pain, swelling, redness, or drainage in your throat.  You develop signs of infection such as dizziness, headache, lethargy, or generalized feelings of illness.  You have difficulty breathing, swallowing or eating.  You show signs of becoming dehydrated (lightheadedness when standing, decreased urine output, a fast heart rate, or dry mouth and mucous membranes). SEEK IMMEDIATE MEDICAL CARE IF:   You have a fever.  You are coughing up or vomiting blood.  You develop more severe throat pain uncontrolled with medicines or you start to drool.  You develop  difficulty breathing, talking, or find it easier to breathe while leaning forward. Document Released: 08/22/2005 Document Revised: 11/14/2011 Document Reviewed: 04/04/2008 [facility] Patient Information 2015 [place], [place]. This information is not intended to replace advice given to you by your health care provider. Make sure you discuss any questions you have with your health care provider.

## 2014-04-16 NOTE — ED Notes (Signed)
Initial Contact - pt A+Ox4, resting on stretcher, reports dx yesterday with strep throat and reports feels as though her symp are worsening today.  Pt reports feeling SOB.  Pt able to clear secretions and maintain airway without issue, however pt is spitting saliva "because it hurts too much to swallow".  Speaking full sentences, rr even/un-lab, lsctab.  Skin PWD.  MAEI.  NAD.

## 2014-04-16 NOTE — ED Notes (Addendum)
Pt was here yesterday diagnosed with strep throat. Pt states now she is having a hard time breathing. NAD. Pt states she has not had a chance to get meds filled

## 2014-04-16 NOTE — ED Provider Notes (Signed)
Patient seen/examined in the Emergency Department in conjunction with Resident Physician Provider Mallory Patient reports sore throat Exam : awake/alert, uvula midline, she has have exudate/edema to oropharynx Plan: will load with decadron and IV clindamycin here Pt currently stable, handling her secretions, no drooling noted, neck is supple    Joya Gaskinsonald W Levon Boettcher, MD 04/16/14 1409

## 2014-04-18 NOTE — Progress Notes (Signed)
04/18/2014 Ferdinand CavaAndrea Schettino RN, CM  Received message from Blanchie ServeJill Wine with patient experience stating that the patient verbalized concerns about the follow up recommendation with an ENT since she currently does not have insurance. This CM contacted the patient and she stated that [facility] because she makes too much money to qualify. This CM then spoke with the patient about the orange card and [facility] to [facility] care with a [facility]CP, use of the discount pharmacy and to make an appointment with a financial counselor and can apply for an orange card there. The patient stated that she is familiar with the orange card. She also inquired about the [facility] charity program and this CM explained that she can also [facility] an [facility] with one of the financial counselors to apply for the charity program. The [facility] for [facility] was provided to the patient. She verbalized understanding and had no other questions or concerns.

## 2014-04-23 ENCOUNTER — Encounter: Payer: Self-pay | Admitting: Family Medicine

## 2014-04-24 ENCOUNTER — Telehealth: Payer: Self-pay | Admitting: Family Medicine

## 2014-04-24 NOTE — Telephone Encounter (Signed)
I called patient and she states she is still symptomatic, sore throat and ears clogged.  She is still on antibiotic.  I advised her she could come here for a follow up and she declined.  Stating she has no insurance.  I explained if she paid for visit at time of service, she would receive discount.

## 2014-07-07 ENCOUNTER — Encounter (HOSPITAL_COMMUNITY): Payer: Self-pay | Admitting: Emergency Medicine

## 2014-10-04 IMAGING — US US OB FOLLOW-UP
1 series · 12 of 28 positions shown · non-contrast
Comparison: none

[Series 1: us ob follow up · 67 acquisitions, 12 frames shown]
[im 3/67]
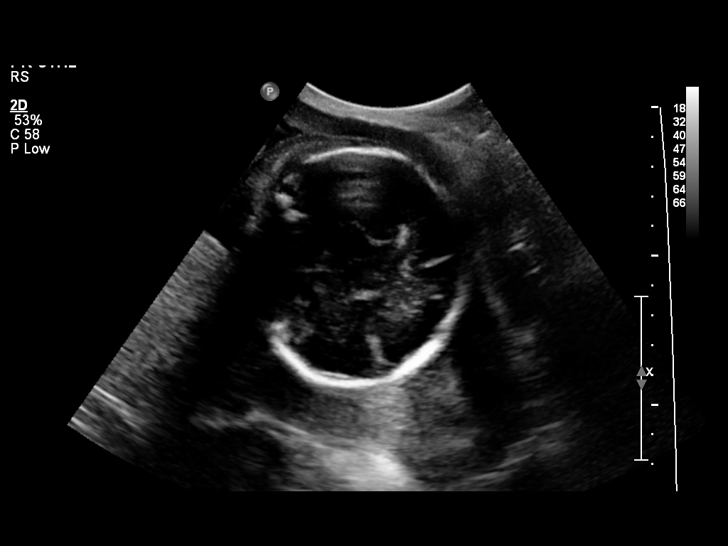
[im 8/67]
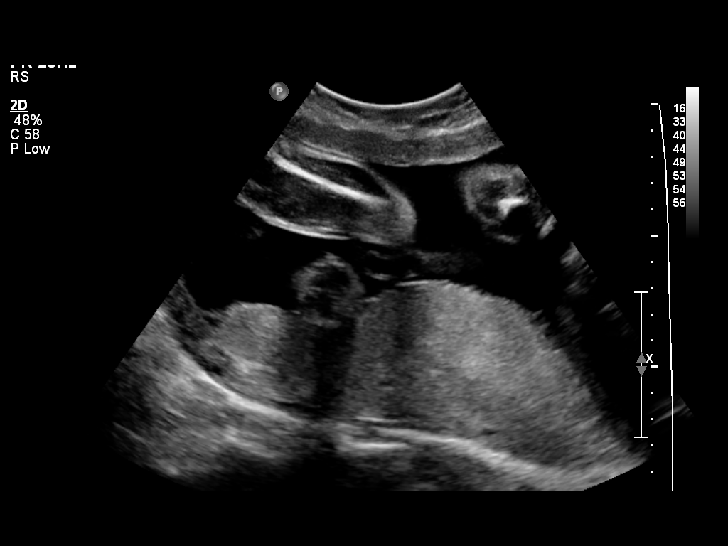
[im 13/67]
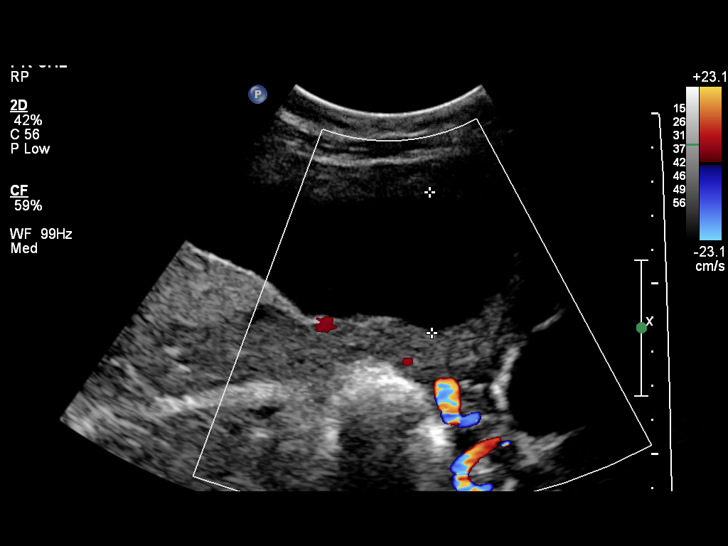
[im 20/67]
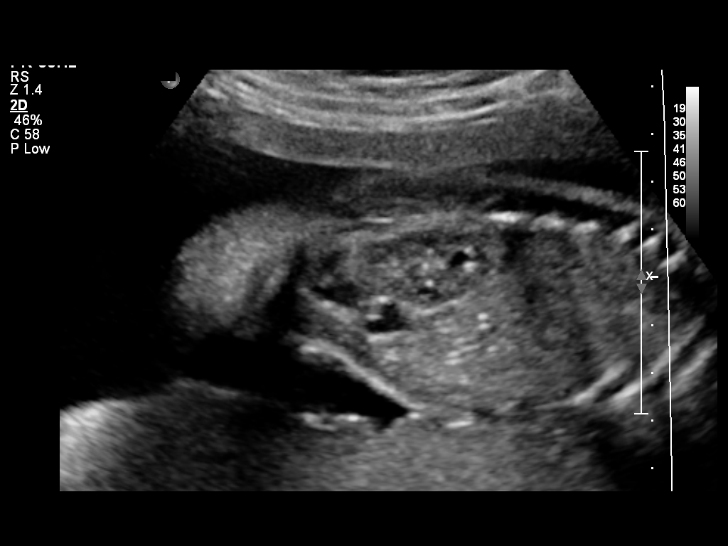
[im 25/67]
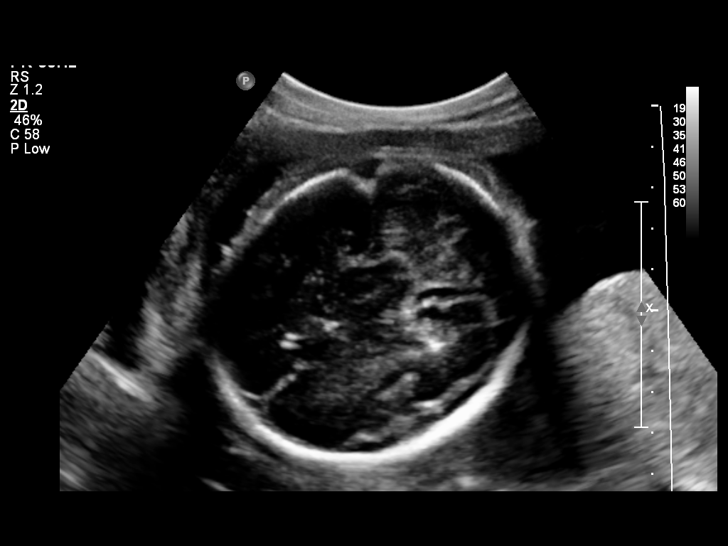
[im 30/67]
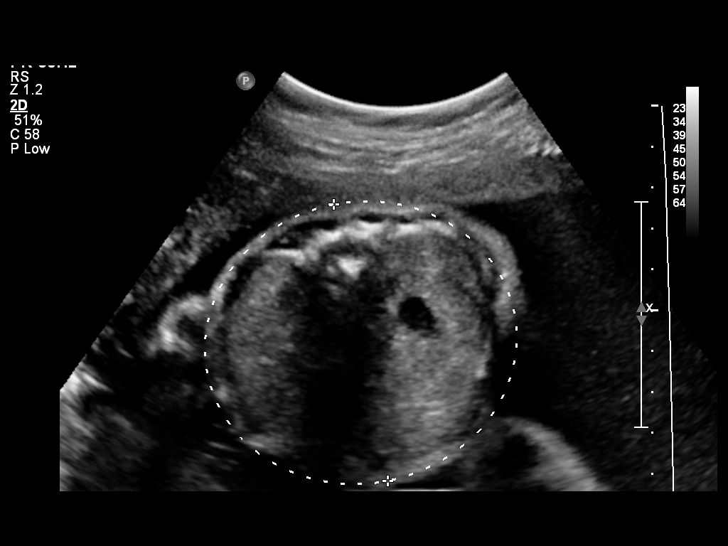
[im 37/67]
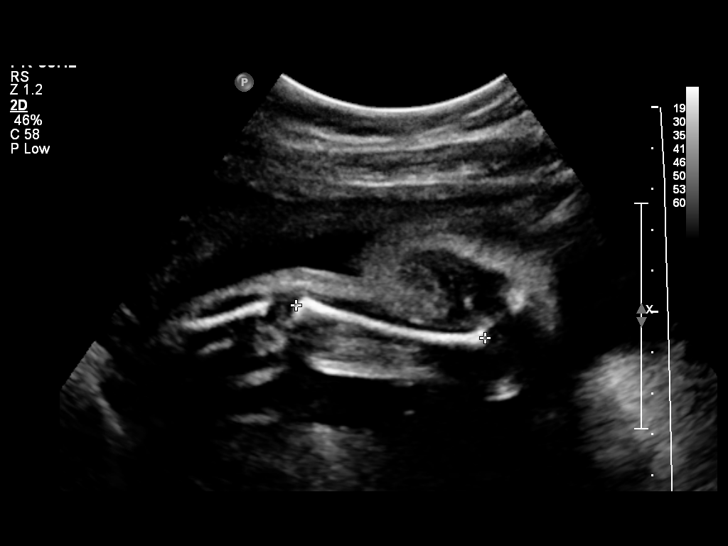
[im 42/67]
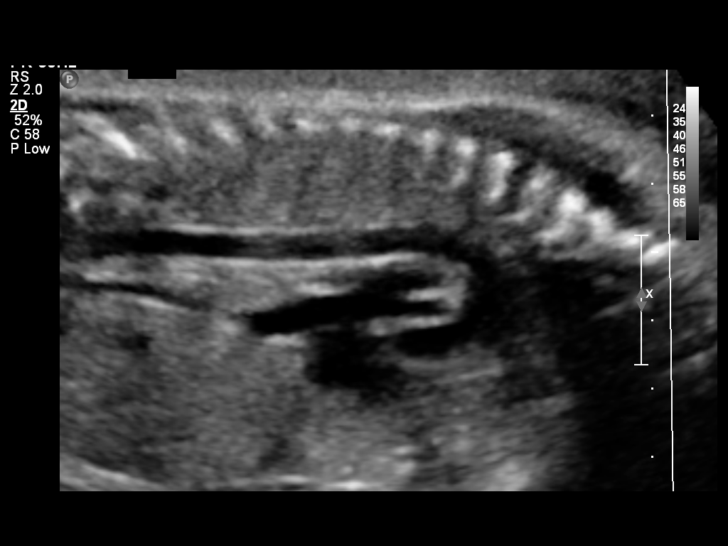
[im 47/67]
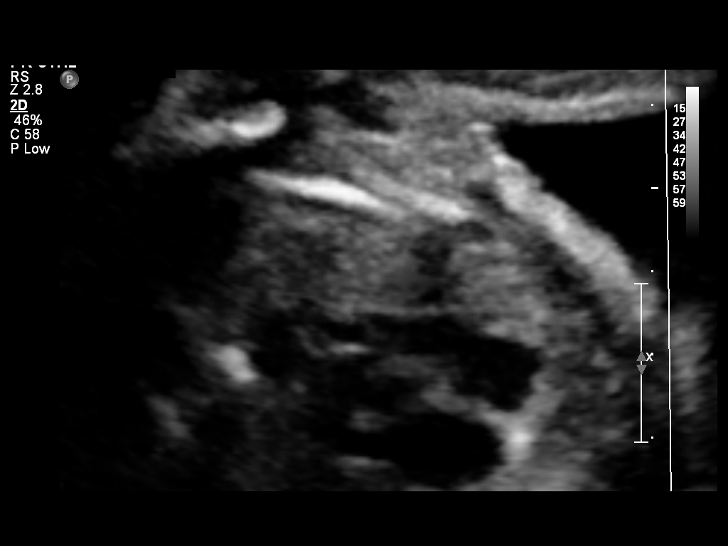
[im 54/67]
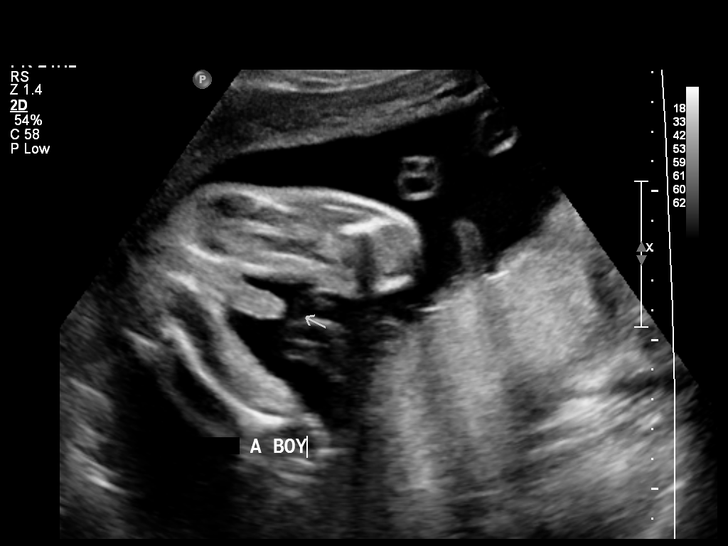
[im 59/67]
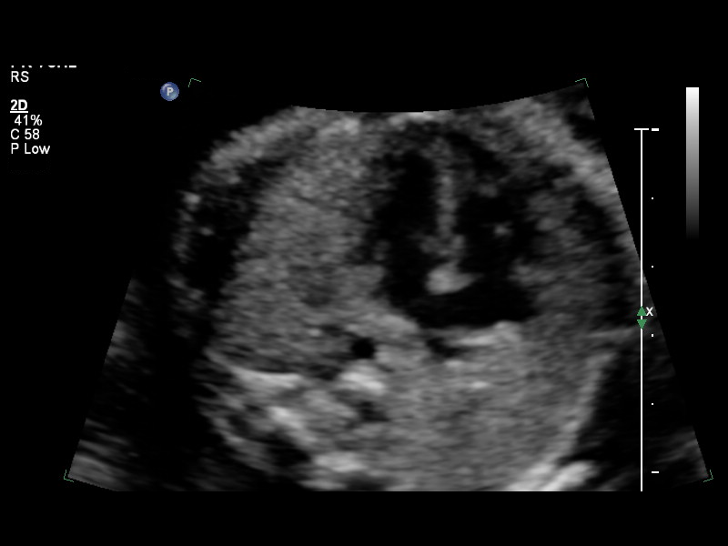
[im 64/67]
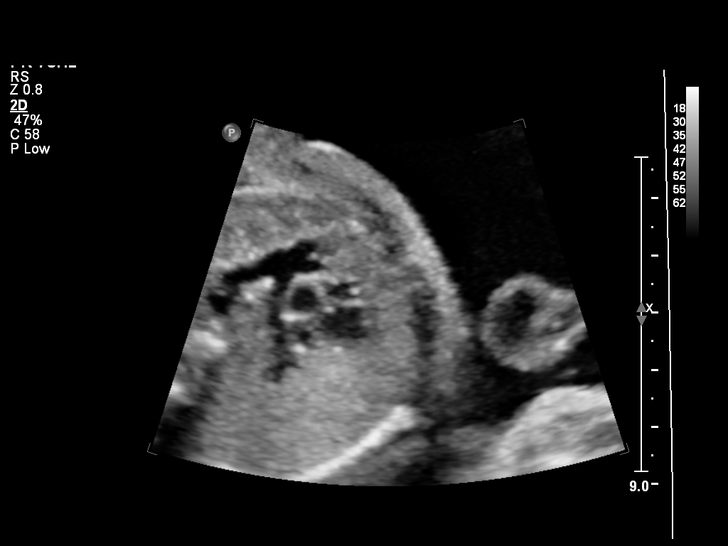

[12 of 28 positions shown; findings below may reference images not displayed]

OBSTETRICS REPORT
                      (Signed Final 10/11/2013 [DATE])

 Name:       MARIEMICHELLE UNAL                  Visit Date: 10/11/2013 [DATE]

Service(s) Provided

 US OB FOLLOW UP                                       76816.1
Indications

 Follow-up incomplete fetal anatomic evaluation
 Advanced maternal age (AMA), Multigravida
 Poor obstetric history: Previous preeclampsia /
 eclampsia/gestational HTN
Fetal Evaluation

 Num Of Fetuses:    1
 Fetal Heart Rate:  144                          bpm
 Cardiac Activity:  Observed
 Presentation:      Cephalic
 Placenta:          Posterior, above cervical
                    os
 P. Cord            Previously Visualized
 Insertion:

 Amniotic Fluid
 AFI FV:      Subjectively within normal limits
 AFI Sum:     19.06   cm       75  %Tile     Larg Pckt:    6.04  cm
 RUQ:   4.59    cm   RLQ:    6.04   cm    LUQ:   4.32    cm   LLQ:    4.11   cm
Biometry

 BPD:     69.2  mm     G. Age:  27w 5d                CI:        73.28   70 - 86
                                                      FL/HC:      19.9   18.6 -

 HC:     256.9  mm     G. Age:  27w 6d       42  %    HC/AC:      1.13   1.05 -

 AC:       228  mm     G. Age:  27w 1d       39  %    FL/BPD:     73.7   71 - 87
 FL:        51  mm     G. Age:  27w 2d       36  %    FL/AC:      22.4   20 - 24
 HUM:     46.2  mm     G. Age:  27w 2d       46  %

 Est. FW:    1771  gm      2 lb 5 oz     53  %
Gestational Age

 U/S Today:     27w 4d                                        EDD:   01/06/14
 Best:          27w 2d     Det. By:  Early Ultrasound         EDD:   01/08/14
Anatomy
 Cranium:          Appears normal         Aortic Arch:      Appears normal
 Fetal Cavum:      Appears normal         Ductal Arch:      Appears normal
 Ventricles:       Appears normal         Diaphragm:        Appears normal
 Choroid Plexus:   Previously seen        Stomach:          Appears normal, left
                                                            sided
 Cerebellum:       Previously seen        Abdomen:          Appears normal
 Posterior Fossa:  Previously seen        Abdominal Wall:   Previously seen
 Nuchal Fold:      Not applicable (>20    Cord Vessels:     Previously seen
                   wks GA)
 Face:             Orbits previously      Kidneys:          Appear normal
                   seen
 Lips:             Previously seen        Bladder:          Appears normal
 Palate:           Not well visualized    Spine:            Previously seen
 Heart:            Appears normal         Lower             Previously seen
                   (4CH, axis, and        Extremities:
                   situs)
 RVOT:             Appears normal         Upper             Previously seen
                                          Extremities:
 LVOT:             Appears normal

 Other:  Fetus appears to be a male. Heels and 5th digit previously seen.
         Technically difficult due to fetal position.
Targeted Anatomy

 Fetal Central Nervous System
 Lat. Ventricles:
Cervix Uterus Adnexa

 Cervical Length:    3.15     cm

 Cervix:       Normal appearance by transabdominal scan.
 Uterus:       No abnormality visualized.
 Left Ovary:    No adnexal mass visualized.
 Right Ovary:   No adnexal mass visualized.
 Adnexa:     No adnexal mass visualized.
Impression

 IUP at 27+2 weeks
 Normal interval anatomy; anatomic survey complete except
 for profile
 Normal amniotic fluid volume
 Appropriate interval growth with EFW at the 53rd %tile
Recommendations

 Follow-up as clinically indicated

 questions or concerns.

## 2014-11-12 ENCOUNTER — Encounter: Payer: Self-pay | Admitting: *Deleted

## 2014-11-12 ENCOUNTER — Ambulatory Visit (INDEPENDENT_AMBULATORY_CARE_PROVIDER_SITE_OTHER): Payer: Medicaid Other | Admitting: *Deleted

## 2014-11-12 VITALS — BP 110/57 | HR 83

## 2014-11-12 DIAGNOSIS — Z3490 Encounter for supervision of normal pregnancy, unspecified, unspecified trimester: Secondary | ICD-10-CM

## 2014-11-12 DIAGNOSIS — O219 Vomiting of pregnancy, unspecified: Secondary | ICD-10-CM

## 2014-11-12 DIAGNOSIS — Z3201 Encounter for pregnancy test, result positive: Secondary | ICD-10-CM

## 2014-11-12 LAB — POCT PREGNANCY, URINE: Preg Test, Ur: POSITIVE — AB

## 2014-11-12 MED ORDER — PROMETHAZINE HCL 25 MG PO TABS: 25.0000 mg | ORAL_TABLET | Freq: Four times a day (QID) | ORAL | Status: DC | PRN

## 2014-11-12 NOTE — Progress Notes (Signed)
Pamela [name] is here today for pregnancy test- which was positive. She states she usually has regular periods but has been under a lot of stress and is not sure when her period was- [facility]. Ordered ultrasound to verify dating.  She states she is not sure if she is going to keep the pregnancy.  Would like medicine for nausea- has severe nausea before and was on a pump. Discussed with Dr. Macon LargeAnyanwu and offered patient zofran if thought to be further than 10 weeks, or phenergan and /or reglan. Phenergan ordered due to patient prefererence. Will follow up with Pamela [name] after ultrasound results available and let her know how far she is. She will then decide if she wants to proceed with pregnancy care in our clinic or elsewhere or not.

## 2014-11-17 ENCOUNTER — Ambulatory Visit (HOSPITAL_COMMUNITY)
Admission: RE | Admit: 2014-11-17 | Discharge: 2014-11-17 | Disposition: A | Discharge status: Home / Self Care | Payer: 59 | Source: Ambulatory Visit | Attending: Obstetrics & Gynecology | Admitting: Obstetrics & Gynecology

## 2014-11-17 DIAGNOSIS — O3481 Maternal care for other abnormalities of pelvic organs, first trimester: Secondary | ICD-10-CM | POA: Insufficient documentation

## 2014-11-17 DIAGNOSIS — N831 Corpus luteum cyst: Secondary | ICD-10-CM | POA: Diagnosis not present

## 2014-11-17 DIAGNOSIS — Z3490 Encounter for supervision of normal pregnancy, unspecified, unspecified trimester: Secondary | ICD-10-CM

## 2014-11-17 DIAGNOSIS — Z3A09 9 weeks gestation of pregnancy: Secondary | ICD-10-CM | POA: Diagnosis not present

## 2014-11-17 DIAGNOSIS — O208 Other hemorrhage in early pregnancy: Secondary | ICD-10-CM | POA: Diagnosis not present

## 2014-11-17 DIAGNOSIS — Z36 Encounter for antenatal screening of mother: Secondary | ICD-10-CM | POA: Diagnosis not present

## 2014-11-19 ENCOUNTER — Telehealth: Payer: Self-pay | Admitting: *Deleted

## 2014-11-19 NOTE — Telephone Encounter (Signed)
-----   Message from Gerome ApleyLinda L Ammy Lienhard, RN sent at 11/12/2014  2:44 PM EST ----- Follow up us from 11/17/14 , let Breawna know how far along she is.

## 2014-11-19 NOTE — Telephone Encounter (Signed)
Called Pamela [name] and notified her of ultrasound results from 11/17/14 showing she is 9wk 4d pregnant.  I also notified her we are not taking new pregnancy patients right now but may call  Health department or provider of her choice if she is going to continue her pregnancy.  She voiced understanding.

## 2014-12-06 IMAGING — US US OB FOLLOW-UP
1 series · 12 of 28 positions shown · non-contrast
Comparison: none

[Series 1: us ob follow up · 12 of 35 slices shown]
[im 2/35]
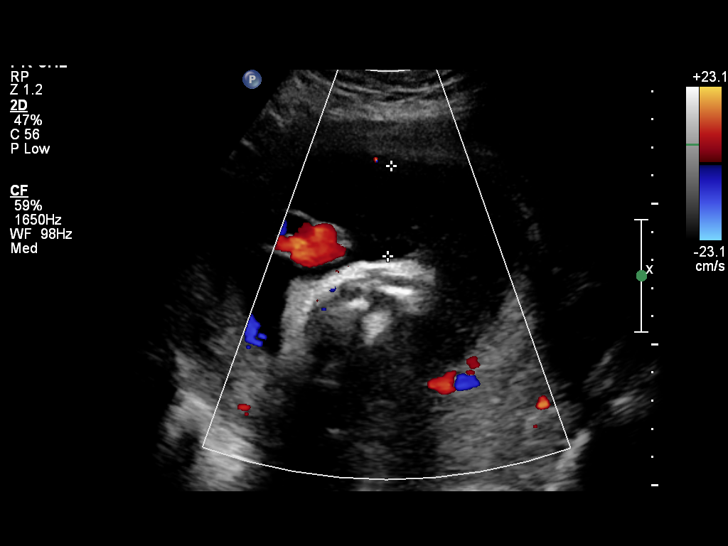
[im 4/35]
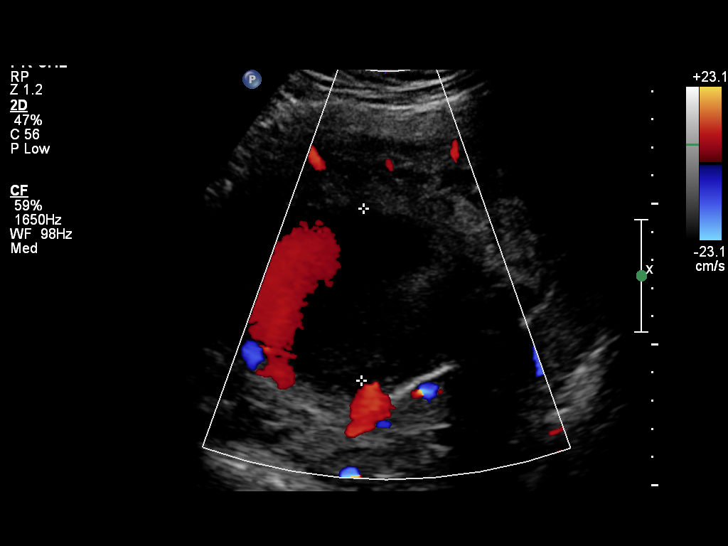
[im 7/35]
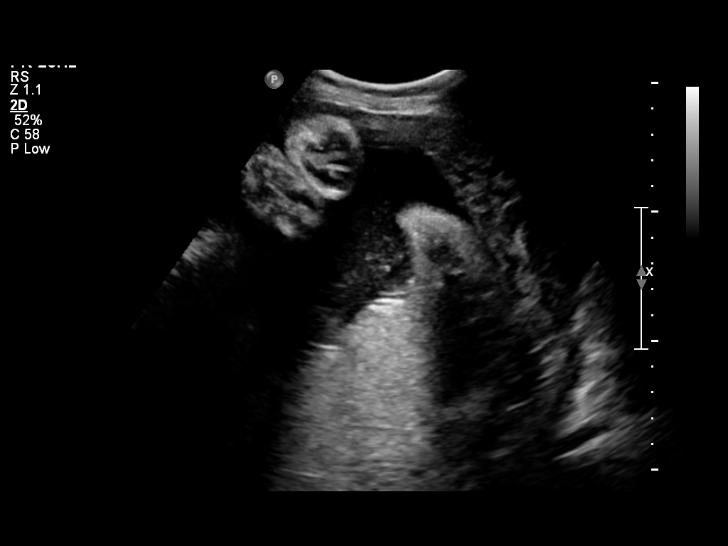
[im 11/35]
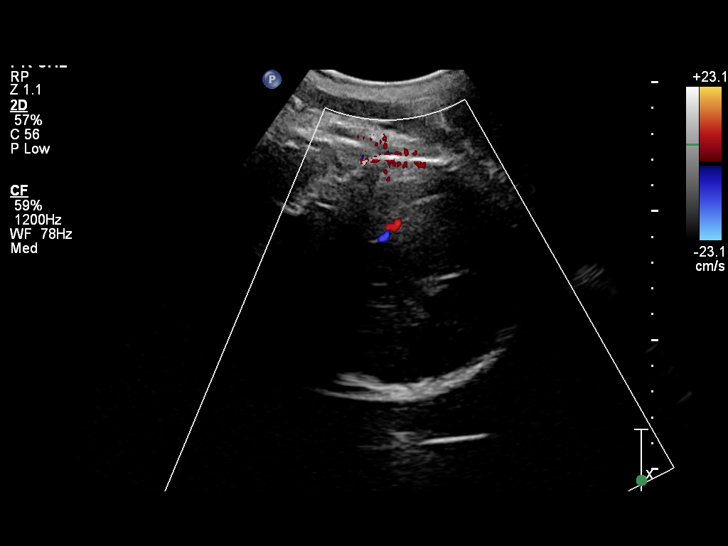
[im 13/35]
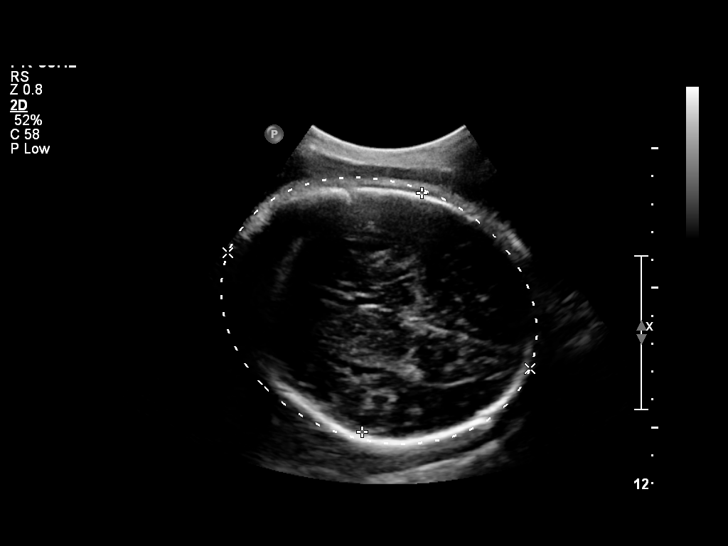
[im 16/35]
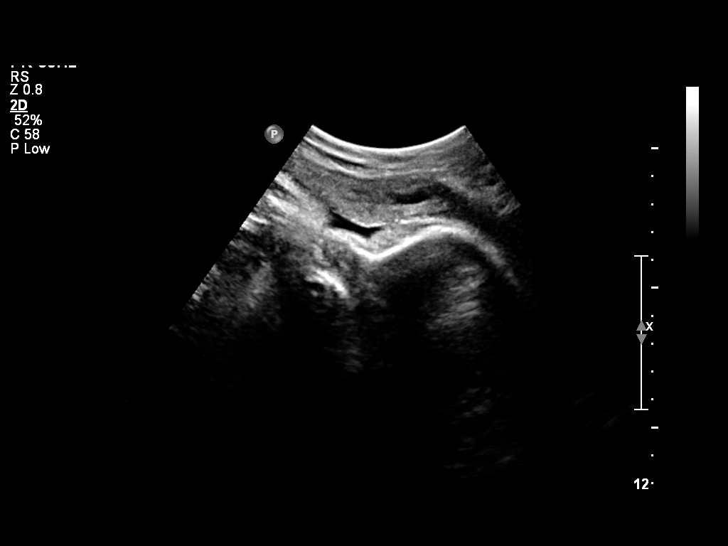
[im 19/35]
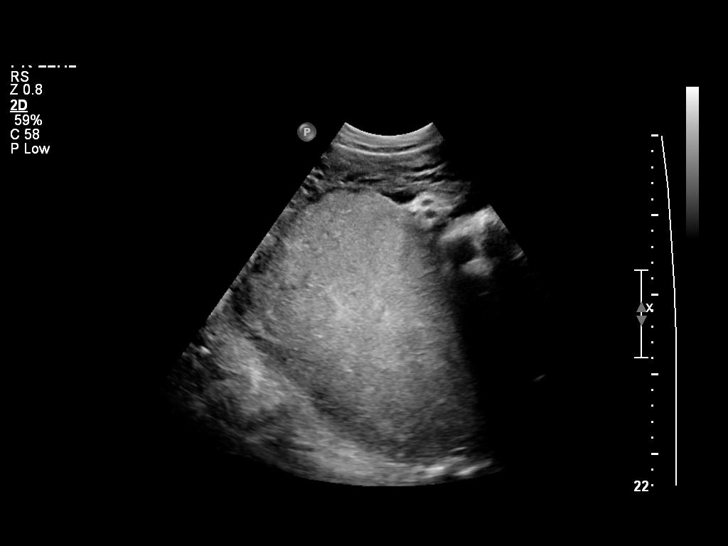
[im 22/35]
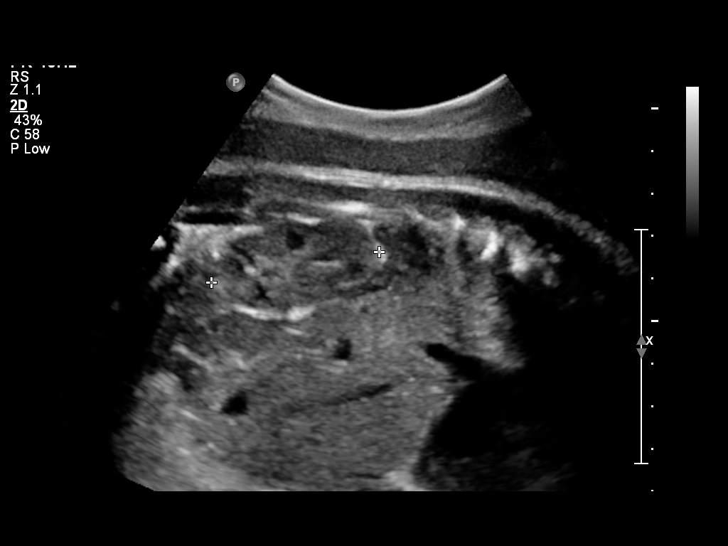
[im 24/35]
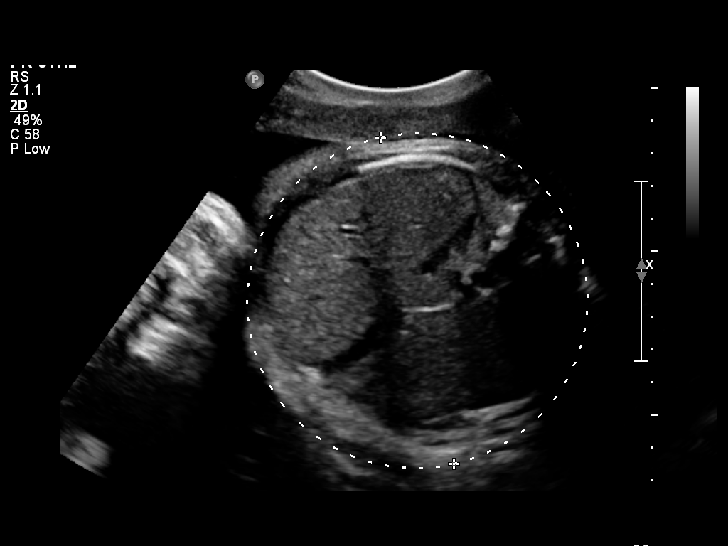
[im 28/35]
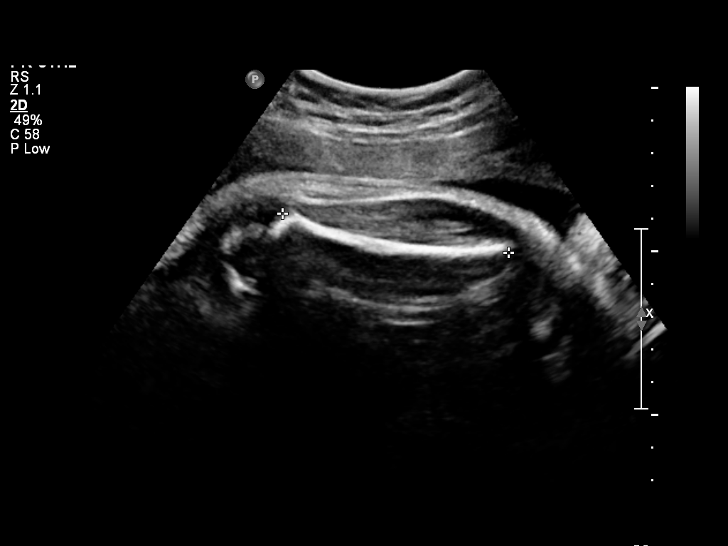
[im 31/35]
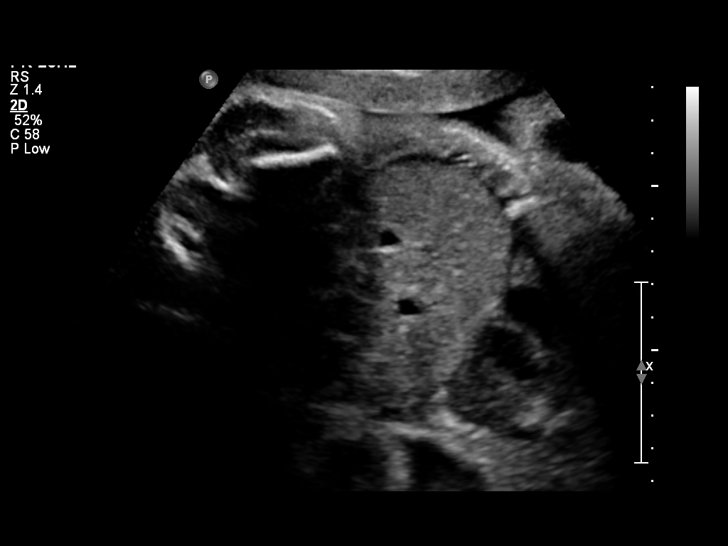
[im 33/35]
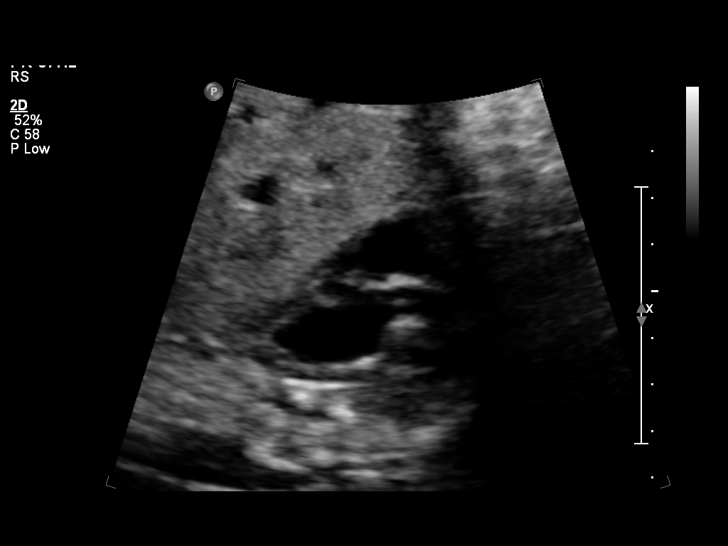

[12 of 28 positions shown; findings below may reference images not displayed]

OBSTETRICS REPORT
                      (Signed Final 12/13/2013 [DATE])

 Name:       MONI MUNIR                  Visit Date: 12/13/2013 [DATE]

Service(s) Provided

 US OB FOLLOW UP                                       76816.1
Indications

 Follow-up incomplete fetal anatomic evaluation
 Advanced maternal age (AMA), Multigravida
 Poor obstetric history: Previous preeclampsia /
 eclampsia/gestational HTN
 Diabetes - Gestational, A1
Fetal Evaluation

 Num Of Fetuses:    1
 Fetal Heart Rate:  142                          bpm
 Cardiac Activity:  Observed
 Presentation:      Cephalic
 Placenta:          Posterior, above cervical
                    os
 P. Cord            Previously Visualized
 Insertion:

 Amniotic Fluid
 AFI FV:      Subjectively within normal limits
 AFI Sum:     17.28   cm       65  %Tile     Larg Pckt:    6.09  cm
 RUQ:   3.2     cm   RLQ:    4.67   cm    LUQ:   3.32    cm   LLQ:    6.09   cm
Biometry

 BPD:     87.9  mm     G. Age:  35w 4d                CI:         74.6   70 - 86
                                                      FL/HC:      21.8   20.1 -

 HC:       323  mm     G. Age:  36w 4d       26  %    HC/AC:      0.99   0.93 -

 AC:     325.9  mm     G. Age:  36w 3d       67  %    FL/BPD:     80.1   71 - 87
 FL:      70.4  mm     G. Age:  36w 1d       42  %    FL/AC:      21.6   20 - 24

 Est. FW:    6677  gm      6 lb 6 oz     66  %
Gestational Age

 U/S Today:     36w 1d                                        EDD:   01/09/14
 Best:          36w 2d     Det. By:  Early Ultrasound         EDD:   01/08/14
Anatomy
 Cranium:          Appears normal         Aortic Arch:      Previously seen
 Fetal Cavum:      Previously seen        Ductal Arch:      Previously seen
 Ventricles:       Appears normal         Diaphragm:        Appears normal
 Choroid Plexus:   Previously seen        Stomach:          Appears normal, left
                                                            sided
 Cerebellum:       Previously seen        Abdomen:          Appears normal
 Posterior Fossa:  Previously seen        Abdominal Wall:   Previously seen
 Nuchal Fold:      Not applicable (>20    Cord Vessels:     Previously seen
                   wks GA)
 Face:             Orbits previously      Kidneys:          Appear normal
                   seen
 Lips:             Previously seen        Bladder:          Appears normal
 Heart:            Previously seen        Spine:            Previously seen
 RVOT:             Appears normal         Lower             Previously seen
                                          Extremities:
 LVOT:             Appears normal         Upper             Previously seen
                                          Extremities:

 Other:  Fetus appears to be a male. Heels and 5th digit previously seen.
         Technically difficult due to fetal position.
Cervix Uterus Adnexa

 Cervix:       Not visualized (advanced GA >34 wks)
Impression

 SIUP at 36+2 weeks
 Normal interval anatomy; anatomic survey complete
 Normal amniotic fluid volume
 Appropriate interval growth with EFW at the 66th %tile

 questions or concerns.

## 2015-11-10 IMAGING — US US OB COMP LESS 14 WK
1 series · 14 of 25 positions shown · non-contrast
Comparison: None.

CLINICAL DATA: Dating

EXAM:
OBSTETRIC <14 WK US AND TRANSVAGINAL OB US
TECHNIQUE: Both transabdominal and transvaginal ultrasound examinations were
performed for complete evaluation of the gestation as well as the
maternal uterus, adnexal regions, and pelvic cul-de-sac.
Transvaginal technique was performed to assess early pregnancy.

[Series 1: us ob transvaginal · 14 of 25 slices shown]
[im 1/25]
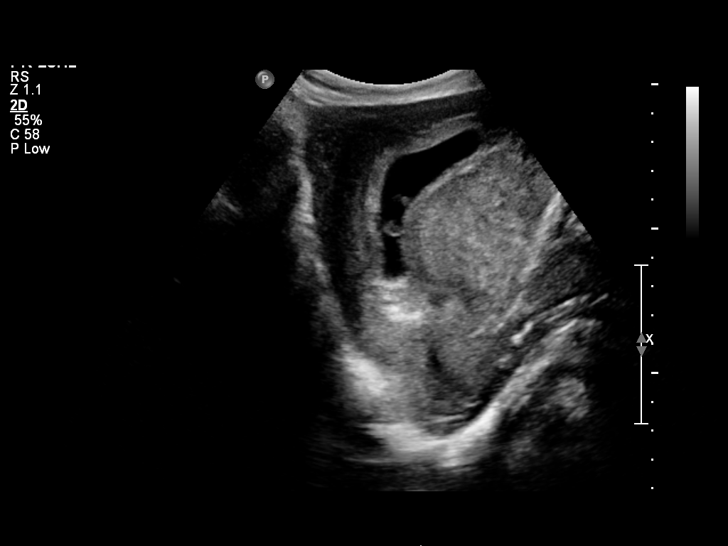
[im 3/25]
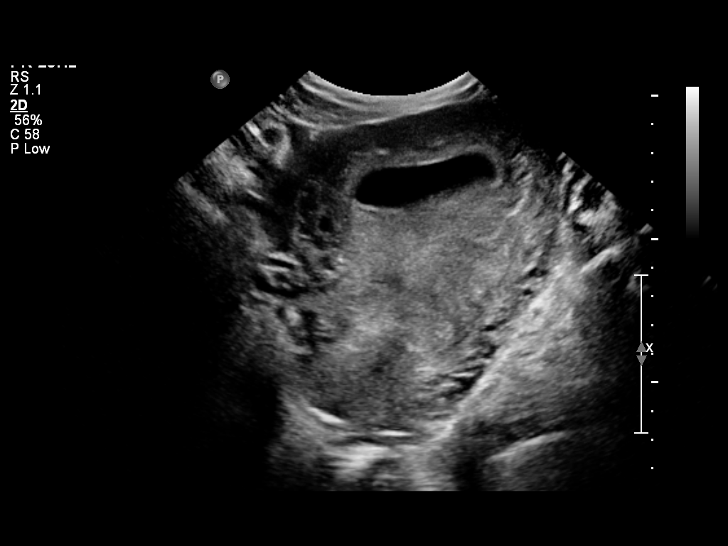
[im 5/25]
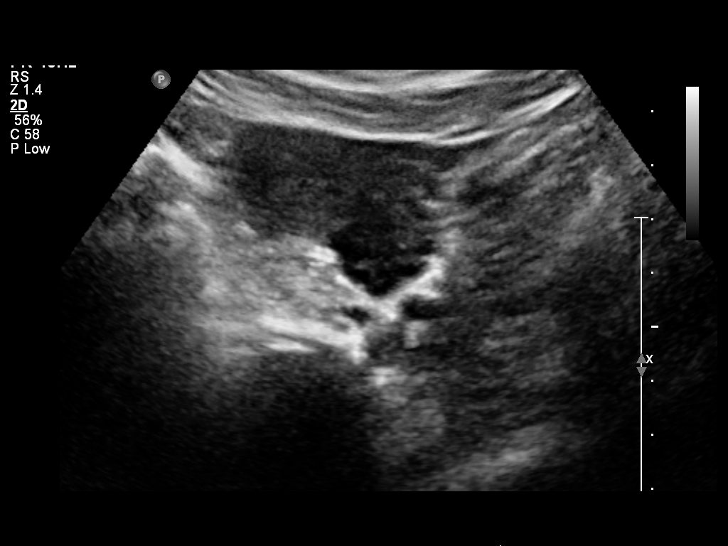
[im 7/25]
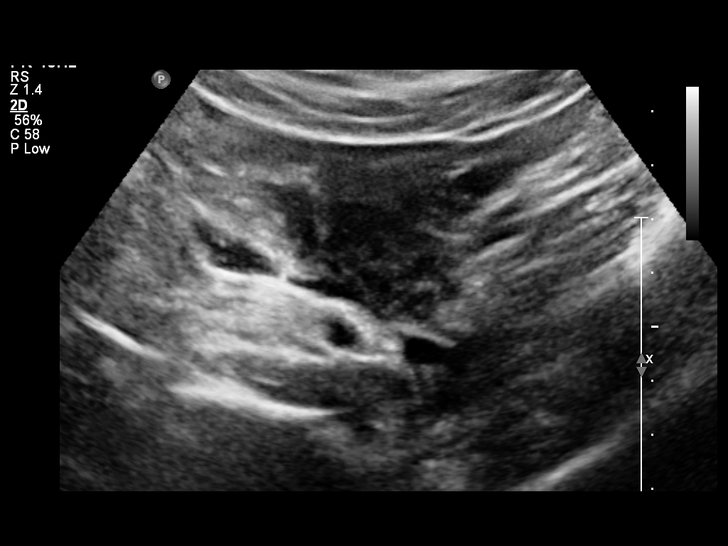
[im 9/25]
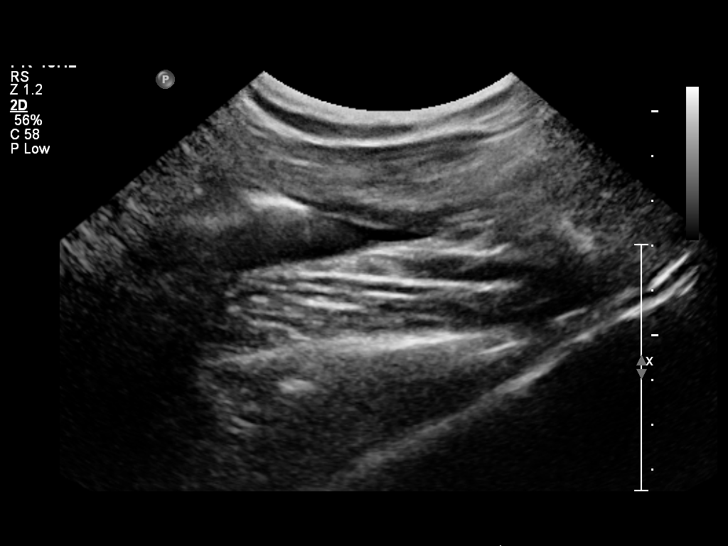
[im 10/25]
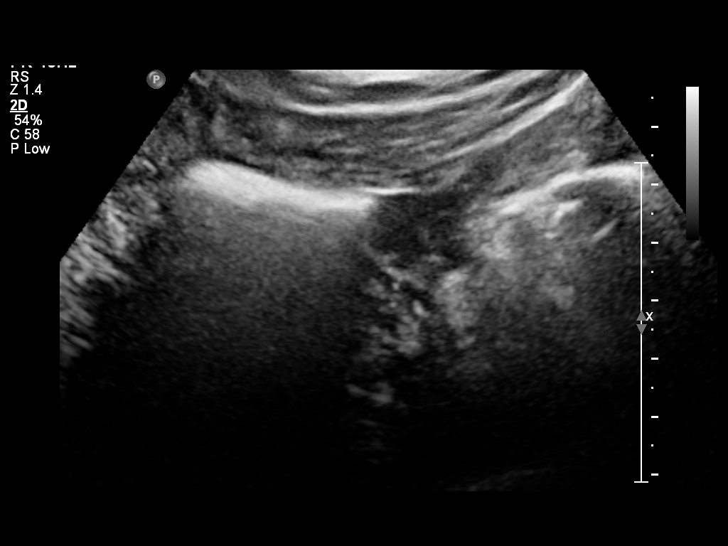
[im 12/25]
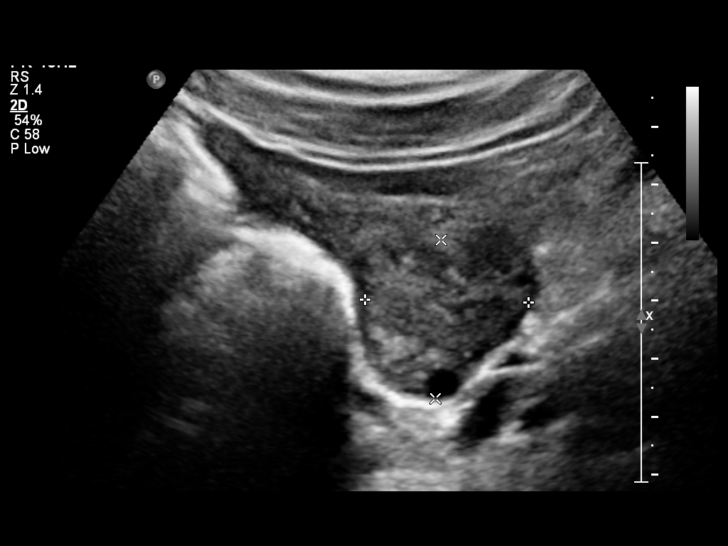
[im 14/25]
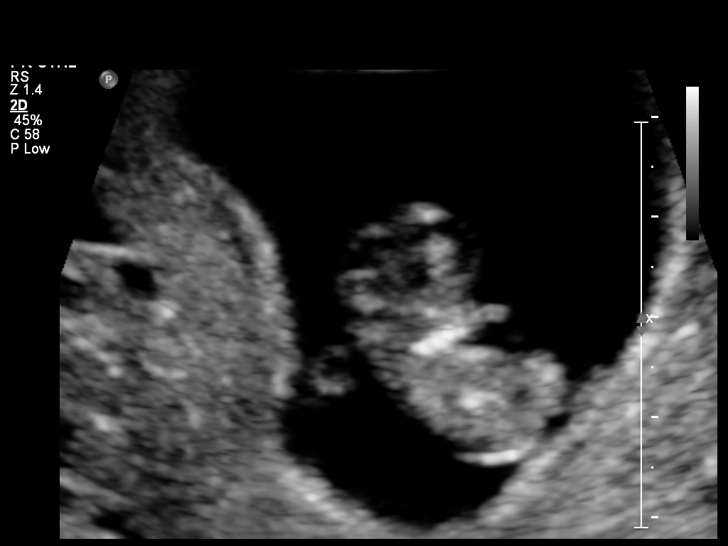
[im 16/25]
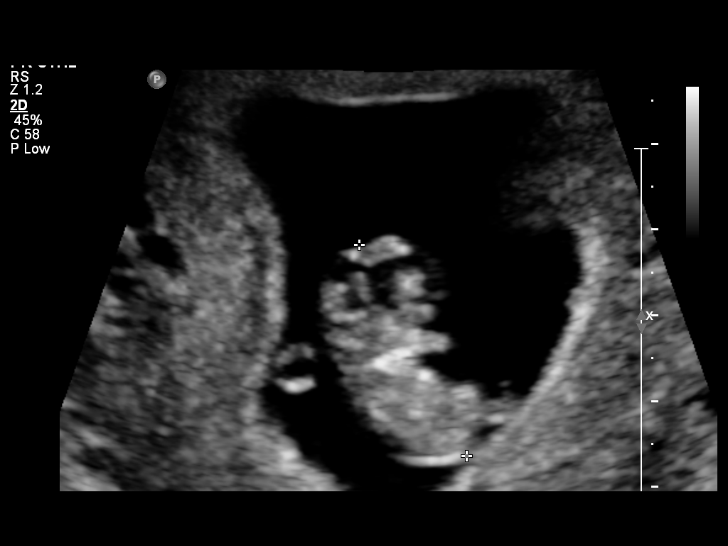
[im 17/25]
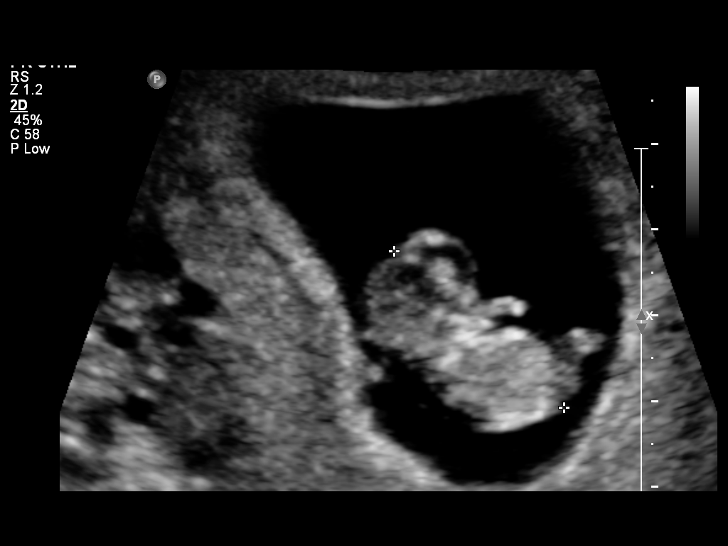
[im 19/25]
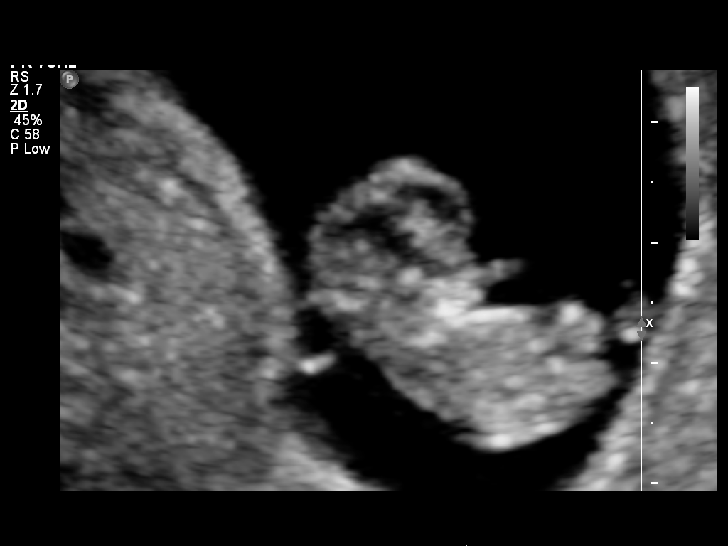
[im 21/25]
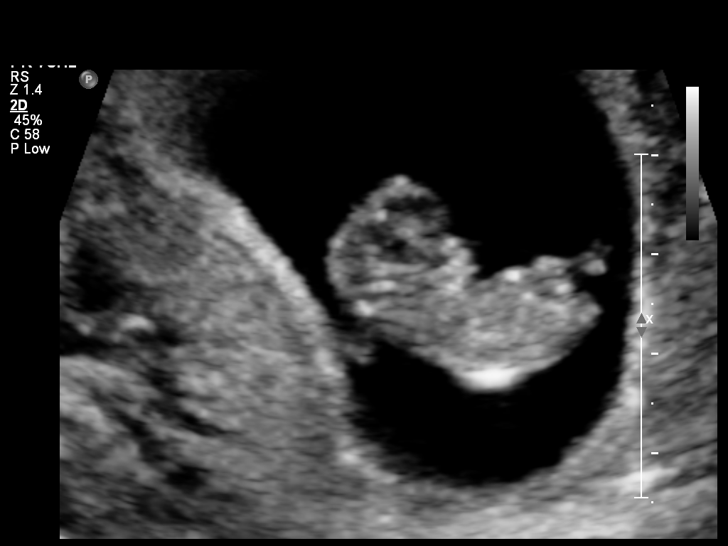
[im 23/25]
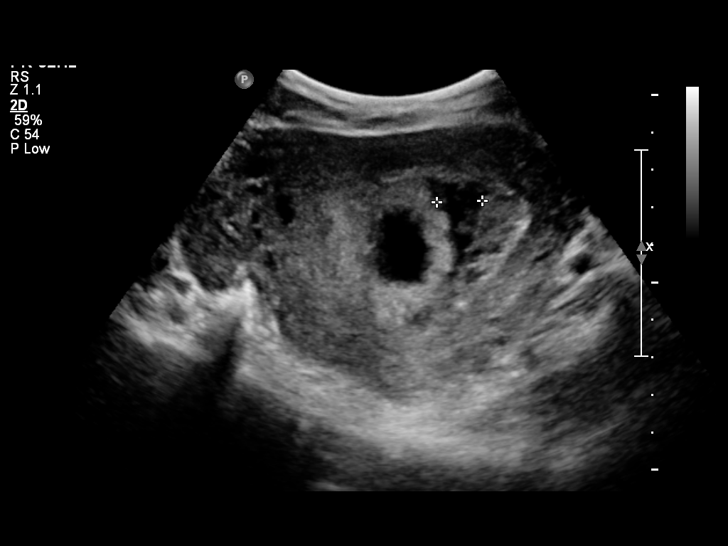
[im 25/25]
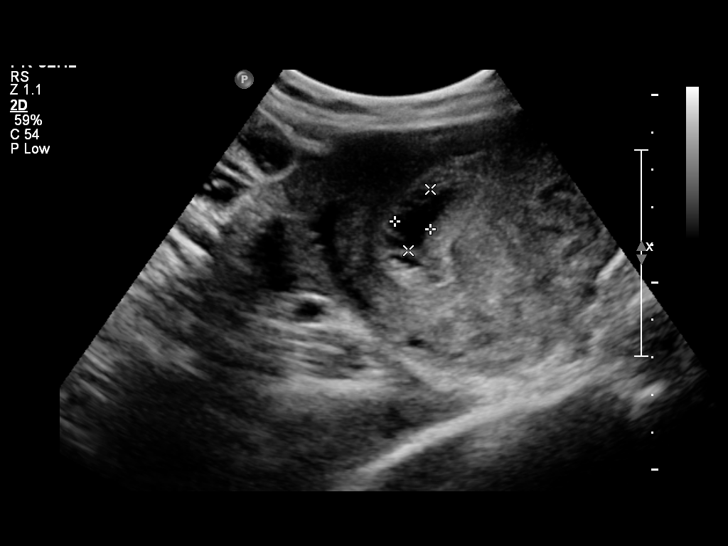

[14 of 25 positions shown; findings below may reference images not displayed]

FINDINGS: Intrauterine gestational sac: Visualized/normal in shape.

Yolk sac:  Visualized

Embryo:  Visualized

Cardiac Activity: Visualized

Heart Rate: 172  bpm

MSD:   mm    w     d

CRL:  27  mm   9 w   4 d                  US EDC: 06/18/2015

Maternal uterus/adnexae: Small subchorionic hemorrhage. Right corpus
luteal cyst noted. No adnexal masses. No free fluid.
IMPRESSION: 9 week 4 day intrauterine pregnancy. Fetal heart rate 172 beats per
minute. Small subchorionic hemorrhage.

## 2016-04-06 ENCOUNTER — Inpatient Hospital Stay (HOSPITAL_COMMUNITY)
Admission: AD | Admit: 2016-04-06 | Discharge: 2016-04-06 | Disposition: A | Discharge status: Home / Self Care | Payer: Medicaid Other | Source: Ambulatory Visit | Attending: Obstetrics & Gynecology | Admitting: Obstetrics & Gynecology

## 2016-04-06 ENCOUNTER — Ambulatory Visit (INDEPENDENT_AMBULATORY_CARE_PROVIDER_SITE_OTHER): Payer: Medicaid Other

## 2016-04-06 ENCOUNTER — Inpatient Hospital Stay (HOSPITAL_COMMUNITY): Payer: Medicaid Other

## 2016-04-06 ENCOUNTER — Encounter: Payer: Self-pay | Admitting: Family Medicine

## 2016-04-06 ENCOUNTER — Encounter (HOSPITAL_COMMUNITY): Payer: Self-pay | Admitting: *Deleted

## 2016-04-06 DIAGNOSIS — Z3201 Encounter for pregnancy test, result positive: Secondary | ICD-10-CM | POA: Diagnosis present

## 2016-04-06 DIAGNOSIS — Z3A01 Less than 8 weeks gestation of pregnancy: Secondary | ICD-10-CM | POA: Diagnosis not present

## 2016-04-06 DIAGNOSIS — O209 Hemorrhage in early pregnancy, unspecified: Secondary | ICD-10-CM | POA: Insufficient documentation

## 2016-04-06 DIAGNOSIS — Z87891 Personal history of nicotine dependence: Secondary | ICD-10-CM | POA: Insufficient documentation

## 2016-04-06 DIAGNOSIS — R112 Nausea with vomiting, unspecified: Secondary | ICD-10-CM

## 2016-04-06 DIAGNOSIS — N760 Acute vaginitis: Secondary | ICD-10-CM

## 2016-04-06 DIAGNOSIS — O4691 Antepartum hemorrhage, unspecified, first trimester: Secondary | ICD-10-CM | POA: Diagnosis not present

## 2016-04-06 DIAGNOSIS — B9689 Other specified bacterial agents as the cause of diseases classified elsewhere: Secondary | ICD-10-CM

## 2016-04-06 DIAGNOSIS — O210 Mild hyperemesis gravidarum: Secondary | ICD-10-CM | POA: Diagnosis not present

## 2016-04-06 LAB — WET PREP, GENITAL
SPERM: NONE SEEN
Trich, Wet Prep: NONE SEEN
WBC WET PREP: NONE SEEN
Yeast Wet Prep HPF POC: NONE SEEN

## 2016-04-06 LAB — URINALYSIS, ROUTINE W REFLEX MICROSCOPIC
BILIRUBIN URINE: NEGATIVE
Glucose, UA: NEGATIVE mg/dL
KETONES UR: NEGATIVE mg/dL
LEUKOCYTES UA: NEGATIVE
NITRITE: NEGATIVE
PROTEIN: NEGATIVE mg/dL
Specific Gravity, Urine: 1.015 (ref 1.005–1.030)
pH: 7.5 (ref 5.0–8.0)

## 2016-04-06 LAB — CBC
HCT: 36 % (ref 36.0–46.0)
Hemoglobin: 12.3 g/dL (ref 12.0–15.0)
MCH: 28.2 pg (ref 26.0–34.0)
MCHC: 34.2 g/dL (ref 30.0–36.0)
MCV: 82.6 fL (ref 78.0–100.0)
Platelets: 348 10*3/uL (ref 150–400)
RBC: 4.36 MIL/uL (ref 3.87–5.11)
RDW: 13.2 % (ref 11.5–15.5)
WBC: 6.8 10*3/uL (ref 4.0–10.5)

## 2016-04-06 LAB — URINE MICROSCOPIC-ADD ON

## 2016-04-06 LAB — POCT PREGNANCY, URINE: PREG TEST UR: POSITIVE — AB

## 2016-04-06 LAB — HCG, QUANTITATIVE, PREGNANCY: HCG, BETA CHAIN, QUANT, S: 31483 m[IU]/mL — AB (ref <5)

## 2016-04-06 MED ORDER — METRONIDAZOLE 500 MG PO TABS: 500.0000 mg | ORAL_TABLET | Freq: Two times a day (BID) | ORAL | 0 refills | Status: AC

## 2016-04-06 MED ORDER — PRENATAL VITAMINS 0.8 MG PO TABS: 1.0000 | ORAL_TABLET | Freq: Every day | ORAL | 12 refills | Status: DC

## 2016-04-06 MED ORDER — DOXYLAMINE-PYRIDOXINE 10-10 MG PO TBEC: 2.0000 | DELAYED_RELEASE_TABLET | Freq: Every evening | ORAL | 3 refills | Status: DC | PRN

## 2016-04-06 MED ORDER — METOCLOPRAMIDE HCL 10 MG PO TABS
10.0000 mg | ORAL_TABLET | Freq: Once | ORAL | Status: AC
  Administered 2016-04-06: 10 mg via ORAL
  Filled 2016-04-06: qty 1

## 2016-04-06 NOTE — MAU Provider Note (Signed)
Chief Complaint: Vaginal Bleeding and Nausea   First Provider Initiated Contact with Patient 04/06/16 1701        SUBJECTIVE  Pamela [name] is a 40 y.o. [id number] at [redacted]w[redacted]d by LMP who presents to maternity admissions reporting bleeding since Friday with a positive pregnancy test.  Also having nausea . She denies vaginal itching/burning, urinary symptoms, h/a, dizziness, or fever/chills.    Vaginal Bleeding  The patient's primary symptoms include vaginal bleeding. The patient's pertinent negatives include no genital itching, genital odor, pelvic pain or vaginal discharge. This is a new problem. The current episode started in the past 7 days. The problem occurs intermittently. The problem has been unchanged. The patient is experiencing no pain. She is pregnant. Associated symptoms include nausea. Pertinent negatives include no abdominal pain, back pain, constipation, diarrhea, dysuria, fever, frequency or vomiting. The vaginal discharge was bloody. The vaginal bleeding is spotting. She has not been passing clots. She has not been passing tissue. Nothing aggravates the symptoms. She has tried nothing for the symptoms. It is unknown whether or not her partner has an STD. She uses nothing for contraception.   RN Note: Pt just came from the clinic for a pregnancy test which was positive.  Pt has pregnancy verification letter.  Pt states she has had bright red spotting since Friday and wanted to have things checked out.  Pt requested a prescription for nausea.  Past Medical History:  Diagnosis Date  . Abnormal Pap smear   . Allergy    RHINITIS  . Diabetes mellitus   . Gestational diabetes    diet controlled  . History of gestational diabetes in prior pregnancy, currently pregnant in second trimester 08/14/2013   A2 GDM x2 on glyburide with both, normal testing after pregnancy, both AGA Clinic: [facility] Genetic Screen Quad nl  Anatomic [place] Normal but needs F/U anatomy 4-6 wks> nl  Glucose Screen  early screen   11/25/2013 157 11/29/2013 Abnl 3 hour GTT 97/154/168/125  GBS pos  Feeding Preference breast  Contraception OCP's (undecided)  Circumcision Yes   Flu Vaccine: 08/14/2013 TDaP:11/25/2013   . Otitis media   . Postpartum care following vaginal delivery (08/04/11) 08/05/2011  . Recurrent sinus infections   . Thyroid disease    THYROID CYST  . Vaginal Pap smear, abnormal    Past Surgical History:  Procedure Laterality Date  . COLPOSCOPY  2004   Social History   Social History  . Marital status: Single    Spouse name: N/A  . Number of children: 1  . Years of education: N/A   Occupational History  . [employer]   Social History Main Topics  . Smoking status: Former Smoker    Types: Cigars    Quit date: 09/05/2006  . Smokeless tobacco: Never Used     Comment: smoked socially for a few yrs  . Alcohol use Yes     Comment: socially  . Drug use: No  . Sexual activity: Yes    Birth control/ protection: None   Other Topics Concern  . Not on file   Social History Narrative  . No narrative on file   No current facility-administered medications on file prior to encounter.    No current outpatient prescriptions on file prior to encounter.   Allergies  Allergen Reactions  . Cephalexin Hives    Pt says she can take PCN without any problems    I have reviewed patient's Past Medical Hx, Surgical Hx, Family Hx, Social Hx,  medications and allergies.   ROS:  Review of Systems  Constitutional: Negative for fever.  Gastrointestinal: Positive for nausea. Negative for abdominal pain, constipation, diarrhea and vomiting.  Genitourinary: Positive for vaginal bleeding. Negative for dysuria, frequency, pelvic pain and vaginal discharge.  Musculoskeletal: Negative for back pain.   Other systems negative  Physical Exam  Patient Vitals for the past 24 hrs:  BP Temp Temp src Pulse Resp SpO2 Height Weight  04/06/16 1622 109/56 98.6 F (37 C) Oral 77 18 99 % - -  04/06/16 1500 - - - - -  - 5' 6.5" (1.689 m) 177 lb 3.2 oz (80.4 kg)   Physical Exam  Constitutional: Well-developed, well-nourished female in no acute distress.  Cardiovascular: normal rate Respiratory: normal effort GI: Abd soft, non-tender. Pos BS x 4 MS: Extremities nontender, no edema, normal ROM Neurologic: Alert and oriented x 4.  GU: Neg CVAT.  PELVIC EXAM: Cervix pink, visually closed, without lesion, scant pink discharge, vaginal walls and external genitalia normal Bimanual exam: Cervix 0/long/high, firm, anterior, neg CMT, uterus nontender, nonenlarged, adnexa without tenderness, enlargement, or mass   LAB RESULTS Results for orders placed or performed during the hospital encounter of 04/06/16 (from the past 24 hour(s))  Urinalysis, Routine w reflex microscopic (not at [facility])     Status: Abnormal   Collection Time: 04/06/16  3:55 PM  Result Value Ref Range   Color, Urine YELLOW YELLOW   APPearance CLEAR CLEAR   Specific Gravity, Urine 1.015 1.005 - 1.030   pH 7.5 5.0 - 8.0   Glucose, UA NEGATIVE NEGATIVE mg/dL   Hgb urine dipstick SMALL (A) NEGATIVE   Bilirubin Urine NEGATIVE NEGATIVE   Ketones, ur NEGATIVE NEGATIVE mg/dL   Protein, ur NEGATIVE NEGATIVE mg/dL   Nitrite NEGATIVE NEGATIVE   Leukocytes, UA NEGATIVE NEGATIVE  Urine microscopic-add on     Status: Abnormal   Collection Time: 04/06/16  3:55 PM  Result Value Ref Range   Squamous Epithelial / LPF 0-5 (A) NONE SEEN   WBC, UA 0-5 0 - 5 WBC/hpf   RBC / HPF 0-5 0 - 5 RBC/hpf   Bacteria, UA FEW (A) NONE SEEN  Wet prep, genital     Status: Abnormal   Collection Time: 04/06/16  5:05 PM  Result Value Ref Range   Yeast Wet Prep HPF POC NONE SEEN NONE SEEN   Trich, Wet Prep NONE SEEN NONE SEEN   Clue Cells Wet Prep HPF POC PRESENT (A) NONE SEEN   WBC, Wet Prep HPF POC NONE SEEN NONE SEEN   Sperm NONE SEEN   CBC     Status: None   Collection Time: 04/06/16  5:10 PM  Result Value Ref Range   WBC 6.8 4.0 - 10.5 K/uL   RBC 4.36 3.87 -  5.11 MIL/uL   Hemoglobin 12.3 12.0 - 15.0 g/dL   HCT 40.9 81.1 - 91.4 %   MCV 82.6 78.0 - 100.0 fL   MCH 28.2 26.0 - 34.0 pg   MCHC 34.2 30.0 - 36.0 g/dL   RDW 78.2 95.6 - 21.3 %   Platelets 348 150 - 400 K/uL  hCG, quantitative, pregnancy     Status: Abnormal   Collection Time: 04/06/16  5:10 PM  Result Value Ref Range   hCG, Beta Chain, Quant, S 31,483 (H) <5 mIU/mL       IMAGING US Ob Comp Less 14 Wks  Result Date: 04/06/2016 CLINICAL DATA:  Pregnant, abdominal pain, bleeding EXAM: OBSTETRIC <14 WK  [place] AND TRANSVAGINAL OB US TECHNIQUE: Both transabdominal and transvaginal ultrasound examinations were performed for complete evaluation of the gestation as well as the maternal uterus, adnexal regions, and pelvic cul-de-sac. Transvaginal technique was performed to assess early pregnancy. COMPARISON:  None. FINDINGS: Intrauterine gestational sac: Single Yolk sac:  Present Embryo:  Not visualized MSD: 14.3  mm   6 w   2  d Subchorionic hemorrhage:  Small subchronic hemorrhage. Maternal uterus/adnexae: Uterine fibroids, including a dominant 2.8 cm intramural right posterior uterine body fibroid. Bilateral ovaries are within normal limits, noting a left corpus luteal cyst. Trace pelvic fluid. IMPRESSION: Single intrauterine gestation, measuring 6 weeks 2 days by mean sac diameter. Electronically Signed   By: Charline Bills M.D.   On: 04/06/2016 18:01   US Ob Transvaginal  Result Date: 04/06/2016 CLINICAL DATA:  Pregnant, abdominal pain, bleeding EXAM: OBSTETRIC <14 WK [place] AND TRANSVAGINAL OB US TECHNIQUE: Both transabdominal and transvaginal ultrasound examinations were performed for complete evaluation of the gestation as well as the maternal uterus, adnexal regions, and pelvic cul-de-sac. Transvaginal technique was performed to assess early pregnancy. COMPARISON:  None. FINDINGS: Intrauterine gestational sac: Single Yolk sac:  Present Embryo:  Not visualized MSD: 14.3  mm   6 w   2  d Subchorionic  hemorrhage:  Small subchronic hemorrhage. Maternal uterus/adnexae: Uterine fibroids, including a dominant 2.8 cm intramural right posterior uterine body fibroid. Bilateral ovaries are within normal limits, noting a left corpus luteal cyst. Trace pelvic fluid. IMPRESSION: Single intrauterine gestation, measuring 6 weeks 2 days by mean sac diameter. Electronically Signed   By: Charline Bills M.D.   On: 04/06/2016 18:01    MAU Management/MDM: Ordered usual first trimester r/o ectopic labs.   Pelvic exam and cultures done Will check baseline Ultrasound to rule out ectopic.  Blood type O+  This bleeding can represent a normal pregnancy with bleeding, spontaneous abortion or even an ectopic which can be life-threatening.  The process as listed above helps to determine which of these is present.  Given Reglan for nausea since she is driving. Wants to try Diclegis for nausea at home  >> feels better after reglan  ASSESSMENT 1. Bleeding in early pregnancy   2. Bleeding in early pregnancy   3.  Single IUP with Yolk Sac 4.  Nausea  PLAN Discharge home Rx given for Diclegis for nausea Follow up in clinic for prenatal care    Medication List    You have not been prescribed any medications.     Pt stable at time of discharge. Encouraged to return here or to other Urgent Care/ED if she develops worsening of symptoms, increase in pain, fever, or other concerning symptoms.    Wynelle Bourgeois CNM, MSN Certified Nurse-Midwife 04/06/2016  5:26 PM

## 2016-04-06 NOTE — MAU Note (Addendum)
Pt just came from the clinic for a pregnancy test which was positive.  Pt has pregnancy verification letter.  Pt states she has had bright red spotting since Friday and wanted to have things checked out.  Pt requested a prescription for nausea.

## 2016-04-06 NOTE — Discharge Instructions (Signed)

## 2016-04-06 NOTE — Progress Notes (Signed)
Patient presented to clinic for a possible pregnancy test. Test confirms she is currently pregnant around [redacted] weeks pregnant with expected due date of 12/07/2016. Patient requested with call her in something for nausea. I have  given patient a listing of safe medications  to take during pregnancy. Patient verbalizes understanding at this time.

## 2016-04-07 LAB — GC/CHLAMYDIA PROBE AMP (~~LOC~~) NOT AT ARMC
Chlamydia: NEGATIVE
Neisseria Gonorrhea: NEGATIVE

## 2016-04-07 LAB — HIV ANTIBODY (ROUTINE TESTING W REFLEX): HIV SCREEN 4TH GENERATION: NONREACTIVE

## 2016-04-25 ENCOUNTER — Telehealth: Payer: Self-pay | Admitting: Advanced Practice Midwife

## 2016-04-25 NOTE — Telephone Encounter (Signed)
Pamela [name] called today. She states that she cannot pick up the diclegis rx because she cannot afford it, and it needs prior auth. She reports that she is having a lot of nausea and vomiting, and she needs something now. She feels she cannot wait for the prior auth to come through. Called in rx to [facility] on [address]. Phenergan 25mg  tabs. 1/2 to 1 tab PO q 6 hours PRN #30 with 1 RF.   Tawnya CrookHogan, Heather Donovan  8:30 PM 04/25/16

## 2016-05-04 ENCOUNTER — Ambulatory Visit: Payer: Medicaid Other | Admitting: Family

## 2016-10-03 ENCOUNTER — Ambulatory Visit (INDEPENDENT_AMBULATORY_CARE_PROVIDER_SITE_OTHER): Payer: Medicaid Other | Admitting: Family Medicine

## 2016-10-03 ENCOUNTER — Encounter: Payer: Self-pay | Admitting: Family Medicine

## 2016-10-03 VITALS — BP 120/80 | HR 80 | Wt 181.0 lb

## 2016-10-03 DIAGNOSIS — M940 Chondrocostal junction syndrome [Tietze]: Secondary | ICD-10-CM | POA: Diagnosis not present

## 2016-10-03 NOTE — Progress Notes (Signed)
   Subjective:    Patient ID: Pamela [name], female    DOB: 03-22-1976, 41 y.o.   MRN: [id number]  HPI she complains of a 6 week history of mid chest tightness that is constant in nature. She did note that deep breathing and stretching does make this worse. Yesterday she applied slight pressure to her anterior chest and took a deep breath which did relieve her symptoms. No fever, chills, shortness of breath or coughing.  Review of Systems     Objective:   Physical Exam Her tendon no distress. Slight tenderness palpation over both third costochondral junctions. Heart and lung exam normal. Slight pressure over that area with deep breathing did relieve her symptoms.       Assessment & Plan:  Costochondritis And the mechanism behind this and recommended heat and 2 Aleve twice per day.

## 2016-10-03 NOTE — Patient Instructions (Signed)
Heat for 20 minutes 3 times per day and 2 Aleve twice per day for the next couple weeks

## 2017-08-06 ENCOUNTER — Encounter: Payer: Self-pay | Admitting: Student

## 2017-08-08 ENCOUNTER — Encounter: Payer: Self-pay | Admitting: Student

## 2017-08-08 ENCOUNTER — Other Ambulatory Visit: Payer: Self-pay | Admitting: Student

## 2017-08-08 ENCOUNTER — Ambulatory Visit: Payer: Medicaid Other | Admitting: Student

## 2017-08-08 ENCOUNTER — Other Ambulatory Visit (HOSPITAL_COMMUNITY)
Admission: RE | Admit: 2017-08-08 | Discharge: 2017-08-08 | Disposition: A | Discharge status: Home / Self Care | Payer: Medicaid Other | Source: Ambulatory Visit | Attending: Student | Admitting: Student

## 2017-08-08 VITALS — BP 102/70 | HR 67 | Ht 67.0 in | Wt 183.7 lb

## 2017-08-08 DIAGNOSIS — N760 Acute vaginitis: Secondary | ICD-10-CM | POA: Insufficient documentation

## 2017-08-08 DIAGNOSIS — B9689 Other specified bacterial agents as the cause of diseases classified elsewhere: Secondary | ICD-10-CM | POA: Diagnosis not present

## 2017-08-08 DIAGNOSIS — Z0000 Encounter for general adult medical examination without abnormal findings: Secondary | ICD-10-CM

## 2017-08-08 DIAGNOSIS — A749 Chlamydial infection, unspecified: Secondary | ICD-10-CM | POA: Insufficient documentation

## 2017-08-08 DIAGNOSIS — A5403 Gonococcal cervicitis, unspecified: Secondary | ICD-10-CM | POA: Diagnosis not present

## 2017-08-08 DIAGNOSIS — Z01419 Encounter for gynecological examination (general) (routine) without abnormal findings: Secondary | ICD-10-CM

## 2017-08-08 DIAGNOSIS — Z23 Encounter for immunization: Secondary | ICD-10-CM

## 2017-08-08 DIAGNOSIS — Z1231 Encounter for screening mammogram for malignant neoplasm of breast: Secondary | ICD-10-CM

## 2017-08-08 DIAGNOSIS — N946 Dysmenorrhea, unspecified: Secondary | ICD-10-CM | POA: Insufficient documentation

## 2017-08-08 DIAGNOSIS — Z113 Encounter for screening for infections with a predominantly sexual mode of transmission: Secondary | ICD-10-CM

## 2017-08-08 NOTE — Progress Notes (Signed)
GYNECOLOGY CLINIC ANNUAL PREVENTATIVE CARE ENCOUNTER NOTE  Subjective:   Pamela [name] is a 41 y.o. [phone] female here for a routine annual gynecologic exam.  Current complaints: vaginal discharge, dysmenorrhea, & hemorrhoids.   Denies abnormal vaginal bleeding, discharge, pelvic pain, problems with intercourse or other gynecologic concerns.  Recently ended a 19 year relationship & is concerned for STI -- would like testing today. Hx of trichomonas 6 years ago. Recently had increased in thick white discharge with vaginal irritation. Treated with OTC yeast medication with improvement in symptoms. Hx of hemorrhoids & feels like she has one now. States a few weeks ago she had constipation which resulted in a painful hemorrhoid. Reports no pain, rectal bleeding, or itching at this time.  Long history of dysmenorrhea since she was 41 years old. At one point was told she would be tested for endometriosis. Family hx of uterine fibroids & is concerned that she has that. Reports heaviness in her lower abdomen that causes pressure during intercourse.  Has not used contraception in 4+ years; previously on OCPs. Unsure what she wants to use now but states she doesn't want any more children.    Gynecologic History Patient's last menstrual period was 07/17/2017 (approximate). Contraception: none Last Pap: 2014. Results were: normal Last mammogram: n/a --- turns 42 next year  Obstetric History OB History  Gravida Para Term Preterm AB Living  5 3 3   1 3   SAB TAB Ectopic Multiple Live Births    1     3    # Outcome Date GA Lbr Len/2nd Weight Sex Delivery Anes PTL Lv  5 Gravida           4 Term 01/02/14 7373w1d 08:43 / 00:01 8 lb 0.9 oz (3.655 kg) M Vag-Spont EPI  LIV     Birth Comments: none  3 Term 08/04/11 1442w2d 06:02 / 00:48 7 lb 14.2 oz (3.578 kg) M Vag-Spont EPI  LIV     Birth Comments: No anomalies noted  2 Term 11/03/07 4782w0d 08:00 7 lb 8 oz (3.402 kg) M Vag-Spont EPI  LIV  1 TAB               Past Medical History:  Diagnosis Date  . Allergy    RHINITIS  . Diabetes mellitus   . Gestational diabetes    diet controlled  . Recurrent sinus infections   . Thyroid disease    THYROID CYST  . Vaginal Pap smear, abnormal     Past Surgical History:  Procedure Laterality Date  . COLPOSCOPY  2004    No current outpatient medications on file prior to visit.   No current facility-administered medications on file prior to visit.     Allergies  Allergen Reactions  . Cephalexin Hives    Pt says she can take PCN without any problems    Social History   Socioeconomic History  . Marital status: Single    Spouse name: Not on file  . Number of children: 1  . Years of education: Not on file  . Highest education level: Not on file  Social Needs  . Financial resource strain: Not on file  . Food insecurity - worry: Not on file  . Food insecurity - inability: Not on file  . Transportation needs - medical: Not on file  . Transportation needs - non-medical: Not on file  Occupational History  . Occupation: CASE M[employer]    Employer: [employer]  Tobacco Use  . Smoking  status: Former Smoker    Types: Cigars    Last attempt to quit: 09/05/2006    Years since quitting: 10.9  . Smokeless tobacco: Never Used  . Tobacco comment: smoked socially for a few yrs  Substance and Sexual Activity  . Alcohol use: Yes    Comment: socially  . Drug use: No  . Sexual activity: Yes    Birth control/protection: None  Other Topics Concern  . Not on file  Social History Narrative  . Not on file    Family History  Problem Relation Age of Onset  . Diabetes Father   . Heart disease Father   . Hypertension Father   . Prostate cancer Father   . Stroke Father   . Asthma Sister   . Hypertension Sister   . Arthritis Paternal Grandmother   . Hypertension Paternal Grandmother   . Diabetes Paternal Grandmother   . Stroke Paternal Grandmother   . Arthritis Paternal Grandfather   . Hypertension  Paternal Grandfather   . Multiple sclerosis Mother     The following portions of the patient's history were reviewed and updated as appropriate: allergies, current medications, past family history, past medical history, past social history, past surgical history and problem list.  Review of Systems Pertinent items noted in HPI and remainder of comprehensive ROS otherwise negative.   Objective:  BP 102/70   Pulse 67   Ht 5\' 7"  (1.702 m)   Wt 183 lb 11.2 oz (83.3 kg)   LMP 07/17/2017 (Approximate)   BMI 28.77 kg/m   Physical Examination: General appearance - alert, well appearing, and in no distress Mental status - alert, oriented to person, place, and time, normal mood, behavior, speech, dress, motor activity, and thought processes Neck - supple, no significant adenopathy Lymphatics - no palpable lymphadenopathy, no hepatosplenomegaly Chest - clear to auscultation, no wheezes, rales or rhonchi, symmetric air entry Heart - normal rate, regular rhythm, normal S1, S2, no murmurs, rubs, clicks or gallops Abdomen - soft, nontender, nondistended, no masses or organomegaly bowel sounds normal Breasts - breasts appear normal, no suspicious masses, no skin or nipple changes or axillary nodes Pelvic - VULVA: normal appearing vulva with no masses, tenderness or lesions, VAGINA: normal appearing vagina with normal color and discharge, no lesions, CERVIX: normal appearing cervix without discharge or lesions, UTERUS: enlarged to 10 week's size, ADNEXA: normal adnexa in size, nontender and no masses, RECTAL: no external hemorrhoids PAP: Pap smear done today    Assessment:  Annual gynecologic examination with pap smear  STI testing Will schedule mammogram after 42th birthday Outpatient pelvic ultrasound for enlarged uterus & hx of dysmenorrhea Plan:  1. Annual physical exam -Contraception list given -- will call back with preference - Flu Vaccine QUAD 36+ mos IM - Cytology - PAP - RPR - HIV  antibody - US PELVIS TRANSVANGINAL NON-OB (TV ONLY); Future - US PELVIS (TRANSABDOMINAL ONLY); Future  2. Dysmenorrhea  - US PELVIS TRANSVANGINAL NON-OB (TV ONLY); Future - US PELVIS (TRANSABDOMINAL ONLY); Future  3. Screen for STD (sexually transmitted disease) -RPR, HIV, GC/CT, Dola Factortrich    Bhavik Cabiness, NP

## 2017-08-08 NOTE — Progress Notes (Signed)
Positive PHQ but refused to talk to Behavioral Clinician.

## 2017-08-08 NOTE — Patient Instructions (Addendum)
Contraception Choices Contraception (birth control) is the use of any methods or devices to prevent pregnancy. Below are some methods to help avoid pregnancy. Hormonal methods  Contraceptive implant. This is a thin, plastic tube containing progesterone hormone. It does not contain estrogen hormone. Your health care provider inserts the tube in the inner part of the upper arm. The tube can remain in place for up to 3 years. After 3 years, the implant must be removed. The implant prevents the ovaries from releasing an egg (ovulation), thickens the cervical mucus to prevent sperm from entering the uterus, and thins the lining of the inside of the uterus.  Progesterone-only injections. These injections are given every 3 months by your health care provider to prevent pregnancy. This synthetic progesterone hormone stops the ovaries from releasing eggs. It also thickens cervical mucus and changes the uterine lining. This makes it harder for sperm to survive in the uterus.  Birth control pills. These pills contain estrogen and progesterone hormone. They work by preventing the ovaries from releasing eggs (ovulation). They also cause the cervical mucus to thicken, preventing the sperm from entering the uterus. Birth control pills are prescribed by a health care provider.Birth control pills can also be used to treat heavy periods.  Minipill. This type of birth control pill contains only the progesterone hormone. They are taken every day of each month and must be prescribed by your health care provider.  Birth control patch. The patch contains hormones similar to those in birth control pills. It must be changed once a week and is prescribed by a health care provider.  Vaginal ring. The ring contains hormones similar to those in birth control pills. It is left in the vagina for 3 weeks, removed for 1 week, and then a new one is put back in place. The patient must be comfortable inserting and removing the ring from  the vagina.A health care provider's prescription is necessary.  Emergency contraception. Emergency contraceptives prevent pregnancy after unprotected sexual intercourse. This pill can be taken right after sex or up to 5 days after unprotected sex. It is most effective the sooner you take the pills after having sexual intercourse. Most emergency contraceptive pills are available without a prescription. Check with your pharmacist. Do not use emergency contraception as your only form of birth control. Barrier methods  Female condom. This is a thin sheath (latex or rubber) that is worn over the penis during sexual intercourse. It can be used with spermicide to increase effectiveness.  Female condom. This is a soft, loose-fitting sheath that is put into the vagina before sexual intercourse.  Diaphragm. This is a soft, latex, dome-shaped barrier that must be fitted by a health care provider. It is inserted into the vagina, along with a spermicidal jelly. It is inserted before intercourse. The diaphragm should be left in the vagina for 6 to 8 hours after intercourse.  Cervical cap. This is a round, soft, latex or plastic cup that fits over the cervix and must be fitted by a health care provider. The cap can be left in place for up to 48 hours after intercourse.  Sponge. This is a soft, circular piece of polyurethane foam. The sponge has spermicide in it. It is inserted into the vagina after wetting it and before sexual intercourse.  Spermicides. These are chemicals that kill or block sperm from entering the cervix and uterus. They come in the form of creams, jellies, suppositories, foam, or tablets. They do not require a prescription. They   are inserted into the vagina with an applicator before having sexual intercourse. The process must be repeated every time you have sexual intercourse. Intrauterine contraception  Intrauterine device (IUD). This is a T-shaped device that is put in a woman's uterus during  a menstrual period to prevent pregnancy. There are 2 types: ? Copper IUD. This type of IUD is wrapped in copper wire and is placed inside the uterus. Copper makes the uterus and fallopian tubes produce a fluid that kills sperm. It can stay in place for 10 years. ? Hormone IUD. This type of IUD contains the hormone progestin (synthetic progesterone). The hormone thickens the cervical mucus and prevents sperm from entering the uterus, and it also thins the uterine lining to prevent implantation of a fertilized egg. The hormone can weaken or kill the sperm that get into the uterus. It can stay in place for 3-5 years, depending on which type of IUD is used. Permanent methods of contraception  Female tubal ligation. This is when the woman's fallopian tubes are surgically sealed, tied, or blocked to prevent the egg from traveling to the uterus.  Hysteroscopic sterilization. This involves placing a small coil or insert into each fallopian tube. Your doctor uses a technique called hysteroscopy to do the procedure. The device causes scar tissue to form. This results in permanent blockage of the fallopian tubes, so the sperm cannot fertilize the egg. It takes about 3 months after the procedure for the tubes to become blocked. You must use another form of birth control for these 3 months.  Female sterilization. This is when the female has the tubes that carry sperm tied off (vasectomy).This blocks sperm from entering the vagina during sexual intercourse. After the procedure, the man can still ejaculate fluid (semen). Natural planning methods  Natural family planning. This is not having sexual intercourse or using a barrier method (condom, diaphragm, cervical cap) on days the woman could become pregnant.  Calendar method. This is keeping track of the length of each menstrual cycle and identifying when you are fertile.  Ovulation method. This is avoiding sexual intercourse during ovulation.  Symptothermal method.  This is avoiding sexual intercourse during ovulation, using a thermometer and ovulation symptoms.  Post-ovulation method. This is timing sexual intercourse after you have ovulated. Regardless of which type or method of contraception you choose, it is important that you use condoms to protect against the transmission of sexually transmitted infections (STIs). Talk with your health care provider about which form of contraception is most appropriate for you. This information is not intended to replace advice given to you by your health care provider. Make sure you discuss any questions you have with your health care provider. Document Released: 08/22/2005 Document Revised: 01/28/2016 Document Reviewed: 02/14/2013 Elsevier Interactive Patient Education  2017 [employer]     Hemorrhoids Hemorrhoids are swollen veins in and around the rectum or anus. Hemorrhoids can cause pain, itching, or bleeding. Most of the time, they do not cause serious problems. They usually get better with diet changes, lifestyle changes, and other home treatments. Follow these instructions at home: Eating and drinking  Eat foods that have fiber, such as whole grains, beans, nuts, fruits, and vegetables. Ask your doctor about taking products that have added fiber (fibersupplements).  Drink enough fluid to keep your pee (urine) clear or pale yellow. For Pain and Swelling  Take a warm-water bath (sitz bath) for 20 minutes to ease pain. Do this 3-4 times a day.  If directed, put ice  on the painful area. It may be helpful to use ice between your warm baths. ? Put ice in a plastic bag. ? Place a towel between your skin and the bag. ? Leave the ice on for 20 minutes, 2-3 times a day. General instructions  Take over-the-counter and prescription medicines only as told by your doctor. ? Medicated creams and medicines that are inserted into the anus (suppositories) may be used or applied as told.  Exercise often.  Go to  the bathroom when you have the urge to poop (to have a bowel movement). Do not wait.  Avoid pushing too hard (straining) when you poop.  Keep the butt area dry and clean. Use wet toilet paper or moist paper towels.  Do not sit on the toilet for a long time. Contact a doctor if:  You have any of these: ? Pain and swelling that do not get better with treatment or medicine. ? Bleeding that will not stop. ? Trouble pooping or you cannot poop. ? Pain or swelling outside the area of the hemorrhoids. This information is not intended to replace advice given to you by your health care provider. Make sure you discuss any questions you have with your health care provider. Document Released: 05/31/2008 Document Revised: 01/28/2016 Document Reviewed: 05/06/2015 Elsevier Interactive Patient Education  [employer]2018 [employer]

## 2017-08-09 LAB — HIV ANTIBODY (ROUTINE TESTING W REFLEX): HIV SCREEN 4TH GENERATION: NONREACTIVE

## 2017-08-09 LAB — RPR: RPR: NONREACTIVE

## 2017-08-11 LAB — CYTOLOGY - PAP
Adequacy: ABSENT
Bacterial vaginitis: POSITIVE — AB
CANDIDA VAGINITIS: NEGATIVE
CHLAMYDIA, DNA PROBE: POSITIVE — AB
Diagnosis: NEGATIVE
HPV: DETECTED — AB
Neisseria Gonorrhea: POSITIVE — AB
TRICH (WINDOWPATH): NEGATIVE

## 2017-08-15 ENCOUNTER — Ambulatory Visit (HOSPITAL_COMMUNITY): Payer: Medicaid Other

## 2017-08-15 ENCOUNTER — Telehealth: Payer: Self-pay | Admitting: *Deleted

## 2017-08-15 NOTE — Telephone Encounter (Signed)
Per Infectious disease report Emery tested postive for chlamydia and gonorrhea. ( per chart also + bv) . I called Pamela [name] and notified her. Informed her we can treat for both in office with shot and pills. She agreed to 0830 appointment tomorrow - will call if she still can not get out of her driveway due to snow). .  I also advised her that her partner should be treated and avoid contact until both treated x 2 weeks. She voices understanding. Communicable disease report filled out/

## 2017-08-16 ENCOUNTER — Ambulatory Visit (INDEPENDENT_AMBULATORY_CARE_PROVIDER_SITE_OTHER): Payer: Medicaid Other

## 2017-08-16 ENCOUNTER — Telehealth: Payer: Self-pay

## 2017-08-16 VITALS — BP 110/58 | HR 84 | Ht 67.0 in

## 2017-08-16 DIAGNOSIS — A549 Gonococcal infection, unspecified: Secondary | ICD-10-CM

## 2017-08-16 MED ORDER — CEFTRIAXONE SODIUM 250 MG IJ SOLR
250.0000 mg | Freq: Once | INTRAMUSCULAR | Status: AC
  Administered 2017-08-16: 250 mg via INTRAMUSCULAR

## 2017-08-16 MED ORDER — AZITHROMYCIN 250 MG PO TABS
1000.0000 mg | ORAL_TABLET | Freq: Every day | ORAL | Status: DC
  Administered 2017-08-16: 1000 mg via ORAL

## 2017-08-16 MED ORDER — AZITHROMYCIN 250 MG PO TABS: 1000.0000 mg | ORAL_TABLET | Freq: Once | ORAL | 0 refills | Status: DC

## 2017-08-16 NOTE — Addendum Note (Signed)
Addended by: Mikey BussingWILSON, Tamon Parkerson L on: 08/16/2017 05:18 PM   Modules accepted: Orders

## 2017-08-16 NOTE — Addendum Note (Signed)
Addended by: Mikey BussingWILSON, CHIQUITA L on: 08/16/2017 05:02 PM   Modules accepted: Orders

## 2017-08-16 NOTE — Telephone Encounter (Signed)
Called patient to inform her that the Rocephin that was given to her could possibly cause an allergic reaction. Explained to patient that the medication is not the same as Cephalexin but it is in the same class of antibiotics as Cephalexin. Patient stated that she is glad that I called and that it is ok. Instructed patient to take Benadryl as a precaution and to come to the hospital if she feels like she can't breathe. Patient verbalized understanding.

## 2017-08-16 NOTE — Progress Notes (Signed)
Patient presented to the office for treatment of Gonorrhea. Patient was given 1 gram of Zithromax and 250 mg of Rocephin. Advised patient to have partner treated. Patient verbalized understanding.

## 2017-08-22 ENCOUNTER — Ambulatory Visit (HOSPITAL_COMMUNITY)
Admission: RE | Admit: 2017-08-22 | Discharge: 2017-08-22 | Disposition: A | Discharge status: Home / Self Care | Payer: Medicaid Other | Source: Ambulatory Visit | Attending: Student | Admitting: Student

## 2017-08-22 DIAGNOSIS — N946 Dysmenorrhea, unspecified: Secondary | ICD-10-CM | POA: Diagnosis not present

## 2017-08-22 DIAGNOSIS — Z0000 Encounter for general adult medical examination without abnormal findings: Secondary | ICD-10-CM

## 2017-08-25 ENCOUNTER — Telehealth: Payer: Self-pay | Admitting: General Practice

## 2017-08-25 NOTE — Telephone Encounter (Signed)
Patient called and left message on nurse line stating she was seen last week and given a prescription. Patient states her insurance won't cover the medication and wants to know if she can have an alternative. Called patient stating I am returning her phone call. Asked patient what medication she was calling in reference to & she states azithromycin. Asked patient if when she received the injection in our office, was she also given 4 pills to take. Patient states yes. Told patient she is covered then & doesn't need the prescription at the pharmacy. Patient verbalized understanding to all & had no questions

## 2017-09-01 ENCOUNTER — Telehealth: Payer: Self-pay | Admitting: General Practice

## 2017-09-01 NOTE — Telephone Encounter (Signed)
Patient called and left message on nurse line requesting ultrasound results. Called patient, no answer- voicemail was full. Could not leave message. Will send letter.

## 2017-09-12 ENCOUNTER — Ambulatory Visit: Payer: 59

## 2017-09-19 ENCOUNTER — Other Ambulatory Visit (HOSPITAL_COMMUNITY)
Admission: RE | Admit: 2017-09-19 | Discharge: 2017-09-19 | Disposition: A | Discharge status: Home / Self Care | Payer: Medicaid Other | Source: Ambulatory Visit | Attending: Obstetrics & Gynecology | Admitting: Obstetrics & Gynecology

## 2017-09-19 ENCOUNTER — Ambulatory Visit: Payer: Medicaid Other | Admitting: General Practice

## 2017-09-19 ENCOUNTER — Ambulatory Visit: Payer: Medicaid Other

## 2017-09-19 DIAGNOSIS — Z0000 Encounter for general adult medical examination without abnormal findings: Secondary | ICD-10-CM | POA: Insufficient documentation

## 2017-09-19 DIAGNOSIS — N760 Acute vaginitis: Secondary | ICD-10-CM | POA: Insufficient documentation

## 2017-09-19 DIAGNOSIS — A5403 Gonococcal cervicitis, unspecified: Secondary | ICD-10-CM | POA: Diagnosis not present

## 2017-09-19 DIAGNOSIS — A749 Chlamydial infection, unspecified: Secondary | ICD-10-CM | POA: Diagnosis not present

## 2017-09-19 DIAGNOSIS — Z113 Encounter for screening for infections with a predominantly sexual mode of transmission: Secondary | ICD-10-CM

## 2017-09-19 DIAGNOSIS — B9689 Other specified bacterial agents as the cause of diseases classified elsewhere: Secondary | ICD-10-CM | POA: Insufficient documentation

## 2017-09-19 NOTE — Progress Notes (Addendum)
Patient here for self swab today for TOC. Patient instructed in self swab & specimen collected. Discussed with patient we will contact her with abnormal results or she can see them in mychart if she signs up. Patient verbalized understanding & had no questions   Attestation of Attending Supervision of RN: Evaluation and management procedures were performed by the nurse under my supervision and collaboration.  I have reviewed the nursing note and chart, and I agree with the management and plan.  Carolyn L. Harraway-Smith, M.D., Evern CoreFACOG

## 2017-09-20 LAB — GC/CHLAMYDIA PROBE AMP (~~LOC~~) NOT AT ARMC
CHLAMYDIA, DNA PROBE: NEGATIVE
Neisseria Gonorrhea: NEGATIVE

## 2017-09-26 ENCOUNTER — Telehealth: Payer: Self-pay | Admitting: *Deleted

## 2017-09-26 NOTE — Telephone Encounter (Signed)
I called Pamela [name] and informed her gc was negative. She voices understanding.

## 2017-09-26 NOTE — Telephone Encounter (Signed)
Pamela [name] called 09/25/17 pm and left a voicemessage she is calling for results from last week.

## 2017-09-28 ENCOUNTER — Ambulatory Visit: Payer: 59

## 2017-10-24 ENCOUNTER — Ambulatory Visit: Payer: Medicaid Other

## 2018-10-17 ENCOUNTER — Other Ambulatory Visit (HOSPITAL_COMMUNITY)
Admission: RE | Admit: 2018-10-17 | Discharge: 2018-10-17 | Disposition: A | Discharge status: Home / Self Care | Payer: Medicaid Other | Source: Ambulatory Visit | Attending: Medical | Admitting: Medical

## 2018-10-17 ENCOUNTER — Encounter: Payer: Self-pay | Admitting: Medical

## 2018-10-17 ENCOUNTER — Ambulatory Visit (INDEPENDENT_AMBULATORY_CARE_PROVIDER_SITE_OTHER): Payer: Medicaid Other | Admitting: Medical

## 2018-10-17 ENCOUNTER — Ambulatory Visit: Payer: Medicaid Other | Admitting: Medical

## 2018-10-17 VITALS — BP 132/81 | HR 80 | Ht 67.0 in | Wt 188.6 lb

## 2018-10-17 DIAGNOSIS — Z01419 Encounter for gynecological examination (general) (routine) without abnormal findings: Secondary | ICD-10-CM | POA: Insufficient documentation

## 2018-10-17 DIAGNOSIS — A749 Chlamydial infection, unspecified: Secondary | ICD-10-CM

## 2018-10-17 DIAGNOSIS — Z3009 Encounter for other general counseling and advice on contraception: Secondary | ICD-10-CM

## 2018-10-17 DIAGNOSIS — Z0000 Encounter for general adult medical examination without abnormal findings: Secondary | ICD-10-CM

## 2018-10-17 MED ORDER — NORGESTIMATE-ETH ESTRADIOL 0.25-35 MG-MCG PO TABS: 1.0000 | ORAL_TABLET | Freq: Every day | ORAL | 3 refills | Status: DC

## 2018-10-17 NOTE — Patient Instructions (Addendum)
Cervical Dysplasia  Cervical dysplasia is a condition in which a woman's cervix cells have abnormal changes. The cervix is the opening of the uterus (womb). It is located between the vagina and the uterus. Cervical dysplasia may be an early sign of cervical cancer. If left untreated, this condition may become more severe and may progress to cervical cancer. Early detection, treatment, and follow-up care are very important. What are the causes? Cervical dysplasia can be caused by a human papillomavirus (HPV) infection. HPV is the most common sexually transmitted infection (STI). HPV is spread from person to person through sexual contact. This includes oral, vaginal, or anal sex. What increases the risk? The following factors may make you more likely to develop this condition:  Having had a sexually transmitted infection (STI), such as herpes, chlamydia or HPV.  Becoming sexually active before age 18.  Having had more than one sexual partner.  Having a sexual partner who has multiple sexual partners.  Not using protection, such as a condom, during sex, especially with new sexual partners.  Having a history of cancer of the vagina or vulva.  Having a weakened body defense (immune) system.  Being the daughter of a woman who took diethylstilbestrol (DES) during pregnancy.  Having a family history of cervical cancer.  Smoking.  Using oral contraceptives, also called birth control pills.  Having had three or more full-term pregnancies. What are the signs or symptoms? There are usually no symptoms of this condition. If you do have symptoms, they may include:  Abnormal vaginal discharge.  Bleeding between periods or after sex.  Bleeding during menopause.  Pain during sex (dyspareunia). How is this diagnosed? A test called a Pap test may be done. During this test, cells are taken from the cervix and then examined under a microscope. A test in which tissue is removed from the cervix  (biopsy) may also be done if the Pap test is abnormal or if the cervix looks abnormal. How is this treated? Treatment varies based on the severity of the condition. Treatment may include:  Cryotherapy. During cryotherapy, the abnormal cells are frozen with a steel-tip instrument.  Loop electrosurgical excision procedure (LEEP). This procedure removes abnormal tissue from the cervix.  Surgery to remove abnormal tissue. This is usually done in more advanced cases. Surgical options include: ? A cone biopsy. This is a procedure in which the cervical canal and a portion of the center of the cervix are removed. ? Hysterectomy. This is a surgery in which the uterus and cervix are removed. Follow these instructions at home:  Take over-the-counter and prescription medicines only as told by your health care provider.  Do not use tampons, have sex, or douche until your health care provider says it is okay.  Keep follow-up visits as told by your health care provider. This is important. Women who have been treated for cervical dysplasia should have regular pelvic exams and Pap tests as told by their health care provider. How is this prevented?  Practice safe sex to help prevent sexually transmitted infections (STI) that may cause this condition.  Have regular Pap tests. Talk with your health care provider about how often you need these tests. Pap tests will help identify cell changes that can lead to cancer. Contact a health care provider if:  You develop genital warts. Get help right away if:  You have a fever.  You have abnormal vaginal discharge.  Your menstrual period is heavier than normal.  You develop bright red bleeding.   This may include blood clots.  You have pain or cramps that get worse, and medicine does not help to relieve your pain.  You feel light-headed and you are unusually weak.  You have fainting spells.  You have pain in the abdomen. Summary  Cervical dysplasia is  a condition in which a woman's cervix cells have abnormal changes.  If left untreated, this condition may become more severe and may progress to cervical cancer.  Early detection, treatment, and follow-up care are very important in managing this condition.  Have regular pelvic exams and Pap tests. Talk with your health care provider about how often you need these tests. Pap tests will help identify cell changes that can lead to cancer. This information is not intended to replace advice given to you by your health care provider. Make sure you discuss any questions you have with your health care provider. Document Released: 08/22/2005 Document Revised: 08/25/2016 Document Reviewed: 08/25/2016 Elsevier Interactive Patient Education  2019 [employer]. Pap Test Why am I having this test? A Pap test, also called a Pap smear, is a screening test to check for signs of:  Cancer of the vagina, cervix, and uterus. The cervix is the lower part of the uterus that opens into the vagina.  Infection.  Changes that may be a sign that cancer is developing (precancerous changes). Women need this test on a regular basis. In general, you should have a Pap test every 3 years until you reach menopause or age 53. Women aged 30-60 may choose to have their Pap test done at the same time as an HPV (human papillomavirus) test every 5 years (instead of every 3 years). Your health care provider may recommend having Pap tests more or less often depending on your medical conditions and past Pap test results. What kind of sample is taken?  Your health care provider will collect a sample of cells from the surface of your cervix. This will be done using a small cotton swab, plastic spatula, or brush. This sample is often collected during a pelvic exam, when you are lying on your back on an exam table with feet in footrests (stirrups). In some cases, fluids (secretions) from the cervix or vagina may also be collected. How do  I prepare for this test?  Be aware of where you are in your menstrual cycle. If you are menstruating on the day of the test, you may be asked to reschedule.  You may need to reschedule if you have a known vaginal infection on the day of the test.  Follow instructions from your health care provider about: ? Changing or stopping your regular medicines. Some medicines can cause abnormal test results, such as digitalis and tetracycline. ? Avoiding douching or taking a bath the day before or the day of the test. Tell a health care provider about:  Any allergies you have.  All medicines you are taking, including vitamins, herbs, eye drops, creams, and over-the-counter medicines.  Any blood disorders you have.  Any surgeries you have had.  Any medical conditions you have.  Whether you are pregnant or may be pregnant. How are the results reported? Your test results will be reported as either abnormal or normal. A false-positive result can occur. A false positive is incorrect because it means that a condition is present when it is not. A false-negative result can occur. A false negative is incorrect because it means that a condition is not present when it is. What do the results  mean? A normal test result means that you do not have signs of cancer of the vagina, cervix, or uterus. An abnormal result may mean that you have:  Cancer. A Pap test by itself is not enough to diagnose cancer. You will have more tests done in this case.  Precancerous changes in your vagina, cervix, or uterus.  Inflammation of the cervix.  An STD (sexually transmitted disease).  A fungal infection.  A parasite infection. Talk with your health care provider about what your results mean. Questions to ask your health care provider Ask your health care provider, or the department that is doing the test:  When will my results be ready?  How will I get my results?  What are my treatment options?  What  other tests do I need?  What are my next steps? Summary  In general, women should have a Pap test every 3 years until they reach menopause or age 3.  Your health care provider will collect a sample of cells from the surface of your cervix. This will be done using a small cotton swab, plastic spatula, or brush.  In some cases, fluids (secretions) from the cervix or vagina may also be collected. This information is not intended to replace advice given to you by your health care provider. Make sure you discuss any questions you have with your health care provider. Document Released: 11/12/2002 Document Revised: 05/01/2017 Document Reviewed: 05/01/2017 Elsevier Interactive Patient Education  2019 [employer]. Levonorgestrel intrauterine device (IUD) What is this medicine? LEVONORGESTREL IUD (LEE voe nor jes trel) is a contraceptive (birth control) device. The device is placed inside the uterus by a healthcare professional. It is used to prevent pregnancy. This device can also be used to treat heavy bleeding that occurs during your period. This medicine may be used for other purposes; ask your health care provider or pharmacist if you have questions. COMMON BRAND NAME(S): Cameron Ali What should I tell my health care provider before I take this medicine? They need to know if you have any of these conditions: -abnormal Pap smear -cancer of the breast, uterus, or cervix -diabetes -endometritis -genital or pelvic infection now or in the past -have more than one sexual partner or your partner has more than one partner -heart disease -history of an ectopic or tubal pregnancy -immune system problems -IUD in place -liver disease or tumor -problems with blood clots or take blood-thinners -seizures -use intravenous drugs -uterus of unusual shape -vaginal bleeding that has not been explained -an unusual or allergic reaction to levonorgestrel, other hormones, silicone, or  polyethylene, medicines, foods, dyes, or preservatives -pregnant or trying to get pregnant -breast-feeding How should I use this medicine? This device is placed inside the uterus by a health care professional. Talk to your pediatrician regarding the use of this medicine in children. Special care may be needed. Overdosage: If you think you have taken too much of this medicine contact a poison control center or emergency room at once. NOTE: This medicine is only for you. Do not share this medicine with others. What if I miss a dose? This does not apply. Depending on the brand of device you have inserted, the device will need to be replaced every 3 to 5 years if you wish to continue using this type of birth control. What may interact with this medicine? Do not take this medicine with any of the following medications: -amprenavir -bosentan -fosamprenavir This medicine may also interact with the following medications: -  aprepitant -armodafinil -barbiturate medicines for inducing sleep or treating seizures -bexarotene -boceprevir -griseofulvin -medicines to treat seizures like carbamazepine, ethotoin, felbamate, oxcarbazepine, phenytoin, topiramate -modafinil -pioglitazone -rifabutin -rifampin -rifapentine -some medicines to treat HIV infection like atazanavir, efavirenz, indinavir, lopinavir, nelfinavir, tipranavir, ritonavir -St. John's wort -warfarin This list may not describe all possible interactions. Give your health care provider a list of all the medicines, herbs, non-prescription drugs, or dietary supplements you use. Also tell them if you smoke, drink alcohol, or use illegal drugs. Some items may interact with your medicine. What should I watch for while using this medicine? Visit your doctor or health care professional for regular check ups. See your doctor if you or your partner has sexual contact with others, becomes HIV positive, or gets a sexual transmitted disease. This  product does not protect you against HIV infection (AIDS) or other sexually transmitted diseases. You can check the placement of the IUD yourself by reaching up to the top of your vagina with clean fingers to feel the threads. Do not pull on the threads. It is a good habit to check placement after each menstrual period. Call your doctor right away if you feel more of the IUD than just the threads or if you cannot feel the threads at all. The IUD may come out by itself. You may become pregnant if the device comes out. If you notice that the IUD has come out use a backup birth control method like condoms and call your health care provider. Using tampons will not change the position of the IUD and are okay to use during your period. This IUD can be safely scanned with magnetic resonance imaging (MRI) only under specific conditions. Before you have an MRI, tell your healthcare provider that you have an IUD in place, and which type of IUD you have in place. What side effects may I notice from receiving this medicine? Side effects that you should report to your doctor or health care professional as soon as possible: -allergic reactions like skin rash, itching or hives, swelling of the face, lips, or tongue -fever, flu-like symptoms -genital sores -high blood pressure -no menstrual period for 6 weeks during use -pain, swelling, warmth in the leg -pelvic pain or tenderness -severe or sudden headache -signs of pregnancy -stomach cramping -sudden shortness of breath -trouble with balance, talking, or walking -unusual vaginal bleeding, discharge -yellowing of the eyes or skin Side effects that usually do not require medical attention (report to your doctor or health care professional if they continue or are bothersome): -acne -breast pain -change in sex drive or performance -changes in weight -cramping, dizziness, or faintness while the device is being inserted -headache -irregular menstrual  bleeding within first 3 to 6 months of use -nausea This list may not describe all possible side effects. Call your doctor for medical advice about side effects. You may report side effects to FDA at 1-800-FDA-1088. Where should I keep my medicine? This does not apply. NOTE: This sheet is a summary. It may not cover all possible information. If you have questions about this medicine, talk to your doctor, pharmacist, or health care provider.  2019 Elsevier/Gold Standard (2016-06-03 14:14:56)  Laparoscopic Tubal Ligation Laparoscopic tubal ligation is a procedure to close the fallopian tubes. This is done so that you cannot get pregnant. When the fallopian tubes are closed, the eggs that your ovaries release cannot enter the uterus, and sperm cannot reach the released eggs. A laparoscopic tubal ligation is sometimes called "getting your  tubes tied." You should not have this procedure if you want to get pregnant someday or if you are unsure about having more children. Tell a health care provider about:  Any allergies you have.  All medicines you are taking, including vitamins, herbs, eye drops, creams, and over-the-counter medicines.  Any problems you or family members have had with anesthetic medicines.  Any blood disorders you have.  Any surgeries you have had.  Any medical conditions you have.  Whether you are pregnant or may be pregnant.  Any past pregnancies. What are the risks? Generally, this is a safe procedure. However, problems may occur, including:  Infection.  Bleeding.  Injury to surrounding organs.  Side effects from anesthetics.  Failure of the procedure. This procedure can increase your risk of a kind of pregnancy in which a fertilized egg attaches to the outside of the uterus (ectopic pregnancy). What happens before the procedure?  Ask your health care provider about: ? Changing or stopping your regular medicines. This is especially important if you are  taking diabetes medicines or blood thinners. ? Taking medicines such as aspirin and ibuprofen. These medicines can thin your blood. Do not take these medicines before your procedure if your health care provider instructs you not to.  Follow instructions from your health care provider about eating and drinking restrictions.  Plan to have someone take you home after the procedure.  If you go home right after the procedure, plan to have someone with you for 24 hours. What happens during the procedure?      You will be given one or more of the following: ? A medicine to help you relax (sedative). ? A medicine to numb the area (local anesthetic). ? A medicine to make you fall asleep (general anesthetic). ? A medicine that is injected into an area of your body to numb everything below the injection site (regional anesthetic).  An IV tube will be inserted into one of your veins. It will be used to give you medicines and fluids during the procedure.  Your bladder may be emptied with a small tube (catheter).  If you have been given a general anesthetic, a tube will be put down your throat to help you breathe.  Two small cuts (incisions) will be made in your lower abdomen and near your belly button.  Your abdomen will be inflated with a gas. This will let the surgeon see better and will give the surgeon room to work.  A thin, lighted tube (laparoscope) with a camera attached will be inserted into your abdomen through one of the incisions. Small instruments will be inserted through the other incision.  The fallopian tubes will be tied off, burned (cauterized), or blocked with a clip, ring, or clamp. A small portion in the center of each fallopian tube may be removed.  The gas will be released from the abdomen.  The incisions will be closed with stitches (sutures).  A bandage (dressing) will be placed over the incisions. The procedure may vary among health care providers and  hospitals. What happens after the procedure?  Your blood pressure, heart rate, breathing rate, and blood oxygen level will be monitored often until the medicines you were given have worn off.  You will be given medicine to help with pain, nausea, and vomiting as needed. This information is not intended to replace advice given to you by your health care provider. Make sure you discuss any questions you have with your health care provider. Document Released:  11/28/2000 Document Revised: 04/18/2017 Document Reviewed: 08/02/2015 Elsevier Interactive Patient Education  [employer]2019 Elsevier Inc.

## 2018-10-17 NOTE — Progress Notes (Signed)
Subjective:    Pamela [name] is a 43 y.o. female who presents for an annual exam. The patient has no complaints today. The patient is sexually active. GYN screening history: last pap: approximate date 08/2017 and was abnormal: +HPV. The patient wears seatbelts: yes. The patient participates in regular exercise: no. Has the patient ever been transfused or tattooed?: no blood tranfusion, yes tattoo. The patient reports that there is not currently domestic violence in her life.   Menstrual History: OB History    Gravida  5   Para  3   Term  3   Preterm      AB  1   Living  3     SAB      TAB  1   Ectopic      Multiple      Live Births  98           Menarche age: 43 years old Patient's last menstrual period was 10/03/2018 (approximate).    The following portions of the patient's history were reviewed and updated as appropriate: allergies, current medications, past family history, past medical history, past social history, past surgical history and problem list.  Review of Systems Pertinent items are noted in HPI.    Objective:   Physical Exam  Nursing note and vitals reviewed. Constitutional: She is oriented to person, place, and time. She appears well-developed and well-nourished. No distress.  HENT:  Head: Normocephalic and atraumatic.  Eyes: EOM are normal.  Neck: Neck supple.  Cardiovascular: Normal rate, regular rhythm and normal heart sounds.  No murmur heard. Respiratory: Effort normal and breath sounds normal. No respiratory distress. She has no wheezes.  GI: Soft. Bowel sounds are normal. She exhibits no distension and no mass. There is no abdominal tenderness. There is no rebound and no guarding.  Genitourinary: There is no rash, tenderness or lesion on the right labia. There is no rash, tenderness or lesion on the left labia. Uterus is not enlarged and not tender. Cervix exhibits no motion tenderness, no discharge and no friability. Right adnexum displays  no mass and no tenderness. Left adnexum displays no mass and no tenderness.    No vaginal discharge or bleeding.  No bleeding in the vagina.  Neurological: She is alert and oriented to person, place, and time.  Skin: Skin is warm and dry. No erythema.  Psychiatric: She has a normal mood and affect.    Assessment:    Healthy female exam.   Birth control counseling   Plan:     Await pap smear results. Including STD testing as recommended. Patient agrees with plan.  Discussed all birth control options. Patient considering LARC vs BTL, however not ready to implement today Desired OCPs for the interim. Discussed increased risk of PE and DVT with OCP use over 40 and smoking. Patient states minimal, social smoking and trying to quit Return to [facility] in 2 weeks for BP check to ensure we can continue OCP use Information given for LARC and BTL. Patient to schedule appointment when she has decided on her long term birth control plan    Kathlene Cote 10/17/2018 3:03 PM

## 2018-10-17 NOTE — Progress Notes (Signed)
Mammogram scheduled for 11/13/18 @ 3:50p, to arrive at 3:30p.

## 2018-10-18 LAB — TSH: TSH: 0.669 u[IU]/mL (ref 0.450–4.500)

## 2018-10-22 LAB — CYTOLOGY - PAP
CHLAMYDIA, DNA PROBE: POSITIVE — AB
Diagnosis: NEGATIVE
HPV: NOT DETECTED
NEISSERIA GONORRHEA: NEGATIVE

## 2018-10-30 ENCOUNTER — Ambulatory Visit: Payer: Medicaid Other

## 2018-11-02 ENCOUNTER — Telehealth: Payer: Self-pay

## 2018-11-02 NOTE — Telephone Encounter (Signed)
Pamela [name] called back and left a message she just missed a call, request a call back.

## 2018-11-02 NOTE — Telephone Encounter (Signed)
Pt called on Nurse Line on 11/01/18 @ 3:19p, requesting test results from Pap & TSH, both show negative.Pt requested call back at [phone].

## 2018-11-02 NOTE — Telephone Encounter (Signed)
Called pt to give test results, no answer, left VM to call back.

## 2018-11-05 NOTE — Telephone Encounter (Signed)
Larue called and left a message she is calling to get results.

## 2018-11-06 NOTE — Telephone Encounter (Signed)
Called patient & informed her of normal results. Patient verbalized understanding and asked about STD testing. Told patient no STD testing was completed. Patient states she had a conversation about it with Raynelle Fanning and assumed it was being done. Per chart review, second order for pap smear is in for [facility]. Called Cytology and tests will be added on, results will be available tomorrow. Discussed with patient. Patient verbalized understanding and asked about HIV & RPR. Told patient those labs were not performed but offered her to come in for labs this evening until 730pm or tomorrow morning when we open at 8am. Patient verbalized understanding & states she will likely come tomorrow morning but possibly this evening. Apologized to patient for the delay/confusion. Patient verbalized understanding & was appreciative. Patient had no questions.

## 2018-11-09 MED ORDER — AZITHROMYCIN 250 MG PO TABS: 1000.0000 mg | ORAL_TABLET | Freq: Once | ORAL | 0 refills | Status: AC

## 2018-11-09 NOTE — Addendum Note (Signed)
Addended by: Marny Lowenstein on: 11/09/2018 12:20 PM   Modules accepted: Orders

## 2018-11-12 ENCOUNTER — Telehealth: Payer: Self-pay | Admitting: *Deleted

## 2018-11-12 NOTE — Telephone Encounter (Signed)
Pamela [name] left a message 11/09/18 pm stating she was notified by her pharmacy that the medication we sent in is not covered by her insurance and wants to know if we can order a medication covered by [facility].    I called Pamela [name] and she states it was for the antibiotic for chlamydia. I informed Pamela [name] [place] medicaid does not cover antibiotics. It only covers pap smear and birth control. I informed her to let [place] know if she any issues with her other birth control pills. She voices understanding.

## 2018-11-13 ENCOUNTER — Inpatient Hospital Stay: Admission: RE | Admit: 2018-11-13 | Payer: Medicaid Other | Source: Ambulatory Visit

## 2018-12-10 ENCOUNTER — Ambulatory Visit: Payer: Medicaid Other

## 2018-12-11 ENCOUNTER — Ambulatory Visit: Payer: Medicaid Other

## 2019-05-27 ENCOUNTER — Ambulatory Visit: Payer: Medicaid Other | Admitting: Family Medicine

## 2019-05-27 ENCOUNTER — Other Ambulatory Visit: Payer: Self-pay

## 2019-06-04 ENCOUNTER — Ambulatory Visit (INDEPENDENT_AMBULATORY_CARE_PROVIDER_SITE_OTHER): Payer: Self-pay | Admitting: Student

## 2019-06-04 ENCOUNTER — Other Ambulatory Visit (HOSPITAL_COMMUNITY)
Admission: RE | Admit: 2019-06-04 | Discharge: 2019-06-04 | Disposition: A | Discharge status: Home / Self Care | Payer: Medicaid Other | Source: Ambulatory Visit | Attending: Student | Admitting: Student

## 2019-06-04 ENCOUNTER — Encounter: Payer: Self-pay | Admitting: Student

## 2019-06-04 ENCOUNTER — Other Ambulatory Visit: Payer: Self-pay

## 2019-06-04 VITALS — BP 97/69 | HR 88 | Wt 199.6 lb

## 2019-06-04 DIAGNOSIS — Z01419 Encounter for gynecological examination (general) (routine) without abnormal findings: Secondary | ICD-10-CM

## 2019-06-04 DIAGNOSIS — N898 Other specified noninflammatory disorders of vagina: Secondary | ICD-10-CM

## 2019-06-04 MED ORDER — NORETHINDRONE 0.35 MG PO TABS: 1.0000 | ORAL_TABLET | Freq: Every day | ORAL | 11 refills | Status: DC

## 2019-06-04 NOTE — Progress Notes (Signed)
  [name] is a 43 y.o. [phone] who presents to clinic today for follow-up for annual gyn exam. She is smoking 2 Black and Milds per day; she stopped taking her sprintec in August when she realized she was working more. She is worried about blood clots so she stopped taking it. Denies any ob-gyn complaints; she would like to stop smoking but says that stress from [place] and her job are making it hard to do so.   The following portions of the patient's history were reviewed and updated as appropriate: allergies, current medications, family history, past medical history, social history, past surgical history and problem list.  Review of Systems:  Review of Systems  Constitutional: Negative.   HENT: Negative.   Eyes: Negative.   Respiratory: Negative.   Cardiovascular: Negative.   Gastrointestinal: Negative.   Genitourinary: Negative.   Musculoskeletal: Negative.   Skin: Negative.   Neurological: Negative.   Psychiatric/Behavioral: Negative.       Objective:  Physical Exam BP 97/69   Pulse 88   Wt 199 lb 9.6 oz (90.5 kg)   BMI 31.26 kg/m  Physical Exam  Constitutional: She appears well-developed.  HENT:  Head: Normocephalic.  Eyes: Pupils are equal, round, and reactive to light.  Neck: Normal range of motion.  Cardiovascular: Normal rate.  Respiratory: Effort normal.  GI: Soft. Bowel sounds are normal.  Genitourinary:    Vagina normal.     Genitourinary Comments: NEFG, no lesions or masses, no CMT, no tenderness over ovaries or adnexa. Cervix is pink with no lesions.    Musculoskeletal: Normal range of motion.  Skin: Skin is warm.  Breast exam: soft, symmetrical. No lumps or masses or irregularities.     Labs and Imaging No results found for this or any previous visit (from the past 24 hour(s)).  No results found.   Assessment & Plan:  1. Women's annual routine gynecological examination  -Will send RX for Micronor; given patient's smoking, it would not be  advisable to consider to keep taking Sprintec.  - Cytology - PAP( [place]) - HIV Antibody (routine testing w rflx) - RPR - Cervicovaginal ancillary only( ) - Urine Culture  2. Vaginal discharge  - RPR - Cervicovaginal ancillary only( [place]) - Urine Culture   Starr Lake, CNM 06/04/2019 12:04 PM

## 2019-06-05 LAB — CERVICOVAGINAL ANCILLARY ONLY
Bacterial Vaginitis (gardnerella): POSITIVE — AB
Candida Glabrata: NEGATIVE
Candida Vaginitis: NEGATIVE
Molecular Disclaimer: NEGATIVE
Molecular Disclaimer: NEGATIVE
Molecular Disclaimer: NEGATIVE
Molecular Disclaimer: NORMAL
Trichomonas: NEGATIVE

## 2019-06-05 LAB — URINE CULTURE

## 2019-06-05 LAB — HIV ANTIBODY (ROUTINE TESTING W REFLEX): HIV Screen 4th Generation wRfx: NONREACTIVE

## 2019-06-05 LAB — RPR: RPR Ser Ql: NONREACTIVE

## 2019-06-07 LAB — CYTOLOGY - PAP
Chlamydia: NEGATIVE
Diagnosis: NEGATIVE
High risk HPV: POSITIVE — AB
Neisseria Gonorrhea: NEGATIVE

## 2019-06-11 ENCOUNTER — Encounter: Payer: Self-pay | Admitting: Student

## 2019-06-11 DIAGNOSIS — R87810 Cervical high risk human papillomavirus (HPV) DNA test positive: Secondary | ICD-10-CM | POA: Insufficient documentation

## 2019-06-19 ENCOUNTER — Other Ambulatory Visit: Payer: Self-pay

## 2019-06-19 ENCOUNTER — Telehealth: Payer: Self-pay

## 2019-06-19 DIAGNOSIS — B9689 Other specified bacterial agents as the cause of diseases classified elsewhere: Secondary | ICD-10-CM

## 2019-06-19 DIAGNOSIS — N760 Acute vaginitis: Secondary | ICD-10-CM

## 2019-06-19 MED ORDER — METRONIDAZOLE 500 MG PO TABS: 500.0000 mg | ORAL_TABLET | Freq: Two times a day (BID) | ORAL | 0 refills | Status: DC

## 2019-06-19 NOTE — Telephone Encounter (Signed)
Pt called originally on Nurse line about getting her Rx for testing positive for BV, called pt back to let her know that sent Flagyl to pharmacy on file, no answer, so left VM.

## 2019-08-21 ENCOUNTER — Other Ambulatory Visit: Payer: Medicaid Other

## 2019-08-23 ENCOUNTER — Other Ambulatory Visit: Payer: Self-pay | Admitting: Cardiology

## 2019-08-23 DIAGNOSIS — Z20822 Contact with and (suspected) exposure to covid-19: Secondary | ICD-10-CM

## 2019-08-24 LAB — NOVEL CORONAVIRUS, NAA: SARS-CoV-2, NAA: NOT DETECTED

## 2019-10-17 ENCOUNTER — Ambulatory Visit (INDEPENDENT_AMBULATORY_CARE_PROVIDER_SITE_OTHER): Payer: 59

## 2019-10-17 ENCOUNTER — Other Ambulatory Visit: Payer: Self-pay

## 2019-10-17 DIAGNOSIS — N760 Acute vaginitis: Secondary | ICD-10-CM

## 2019-10-17 DIAGNOSIS — N898 Other specified noninflammatory disorders of vagina: Secondary | ICD-10-CM

## 2019-10-17 DIAGNOSIS — B9689 Other specified bacterial agents as the cause of diseases classified elsewhere: Secondary | ICD-10-CM | POA: Diagnosis not present

## 2019-10-17 DIAGNOSIS — Z113 Encounter for screening for infections with a predominantly sexual mode of transmission: Secondary | ICD-10-CM | POA: Diagnosis not present

## 2019-10-17 NOTE — Progress Notes (Signed)
Pt here today for vaginal discharge clear with slight odor.  Pt reports that she did start her period and that could be the smell but still wanted to be tested along with STI testing.  Pt explained how to obtain a self swab and that we will call with abnormal results within 48 hrs.  Pt verbalized understanding.  Addison Naegeli, RN 10/17/19

## 2019-10-18 NOTE — Progress Notes (Signed)
Patient ID: Pamela [name], female   DOB: Mar 13, 1976, 44 y.o.   MRN: [id number] Patient seen and assessed by nursing staff during this encounter. I have reviewed the chart and agree with the documentation and plan.  Scheryl Darter, MD 10/18/2019 5:36 PM

## 2019-10-21 ENCOUNTER — Other Ambulatory Visit: Payer: Self-pay | Admitting: Obstetrics & Gynecology

## 2019-10-21 DIAGNOSIS — N760 Acute vaginitis: Secondary | ICD-10-CM

## 2019-10-21 DIAGNOSIS — B9689 Other specified bacterial agents as the cause of diseases classified elsewhere: Secondary | ICD-10-CM

## 2019-10-21 LAB — CERVICOVAGINAL ANCILLARY ONLY
Bacterial Vaginitis (gardnerella): POSITIVE — AB
Candida Glabrata: NEGATIVE
Candida Vaginitis: NEGATIVE
Chlamydia: NEGATIVE
Comment: NEGATIVE
Comment: NEGATIVE
Comment: NEGATIVE
Comment: NEGATIVE
Comment: NEGATIVE
Comment: NORMAL
Neisseria Gonorrhea: NEGATIVE
Trichomonas: NEGATIVE

## 2019-10-21 MED ORDER — METRONIDAZOLE 500 MG PO TABS: 500.0000 mg | ORAL_TABLET | Freq: Two times a day (BID) | ORAL | 0 refills | Status: DC

## 2019-10-21 NOTE — Progress Notes (Signed)
Flagyl ordered for BV

## 2020-02-06 ENCOUNTER — Telehealth: Payer: 59 | Admitting: Medical

## 2020-02-06 ENCOUNTER — Encounter: Payer: Self-pay | Admitting: Medical

## 2020-02-06 ENCOUNTER — Other Ambulatory Visit: Payer: Self-pay

## 2020-02-06 VITALS — Ht 67.0 in | Wt 202.0 lb

## 2020-02-06 DIAGNOSIS — M79644 Pain in right finger(s): Secondary | ICD-10-CM | POA: Diagnosis not present

## 2020-02-06 DIAGNOSIS — M7989 Other specified soft tissue disorders: Secondary | ICD-10-CM

## 2020-02-06 DIAGNOSIS — J329 Chronic sinusitis, unspecified: Secondary | ICD-10-CM | POA: Diagnosis not present

## 2020-02-06 DIAGNOSIS — H9209 Otalgia, unspecified ear: Secondary | ICD-10-CM | POA: Diagnosis not present

## 2020-02-06 MED ORDER — AMOXICILLIN 875 MG PO TABS: 875.0000 mg | ORAL_TABLET | Freq: Two times a day (BID) | ORAL | 0 refills | Status: DC

## 2020-02-06 NOTE — Progress Notes (Signed)
Subjective:     Patient ID: Pamela [name], female   DOB: [birth year], 44 y.o.   MRN: [id number]  This visit type was conducted due to national recommendations for restrictions regarding the COVID-19 Pandemic (e.g. social distancing) in an effort to limit this patient's exposure and mitigate transmission in our community.  Due to their co-morbid illnesses, this patient is at least at moderate risk for complications without adequate follow up.  This format is felt to be most appropriate for this patient at this time.    Documentation for virtual audio and video telecommunications through Zoom encounter:  The patient was located at home. The provider was located in the office. The patient did consent to this visit and is aware of possible charges through their insurance for this visit.  The other persons participating in this telemedicine service were none. Time spent on call was 20 minutes and in review of previous records 20 minutes total.  This virtual service is not related to other E/M service within previous 7 days.   HPI Chief Complaint  Patient presents with  . Sinus Problem    wants referral to ent- facial pressure   . Ear Pain    left   . Hand Pain    right thumb    Virtual consult for sinus pressure, ear pain.  She notes left ear ache, sinus pressure.  No fever ,no sore throat, no cough, no head or chest congestion, no NVD, no body aches, no chills.   She is a smoker.   Has tried some ibuprofen last week.  Has had symptoms for about a week.  Had worse headaches last week.  No sick contacts.  Feels like she has sinus problems all the time.  Ever since moving to [place] in 1997 has had this problem, occasionally gets double ear infection.  Would like to consider ENT consult.  Not on daily allergy regimen.    Not currently taking decongestant or mucinex.  She feels like those don't work now.      Has not had covid vaccine.   No recent covid test.    She notes 2016 multiple  treatment for strep throat that year.   Had scan at that time concerning for chronic sinus problems.   Never went to ENT at that time despite referral.  She notes having some right thumb problems.  2 months ago was helping someone put a car seat in.   She jambed thumb into the back of the seat.  She notes for a few weeks had pain and swelling of the thumb.  Swelling hasn't resolved.    Has a bulging appearance of bone.  She can still use the thumb but it gets sore after using the thumb a lot.  No numbness, no tingling .  Past Medical History:  Diagnosis Date  . Allergy    RHINITIS  . Diabetes mellitus   . Gestational diabetes    diet controlled  . Recurrent sinus infections   . Thyroid disease    THYROID CYST  . Vaginal Pap smear, abnormal    No current outpatient medications on file prior to visit.   No current facility-administered medications on file prior to visit.    Review of Systems As in subjective    Objective:   Physical Exam Due to coronavirus pandemic stay at home measures, patient visit was virtual and they were not examined in person.   Ht 5\' 7"  (1.702 m)   Wt 202 lb (91.6  kg)   BMI 31.64 kg/m   Gen: wd, wn ,nad      Assessment:     Encounter Diagnoses  Name Primary?  . Chronic congestion of paranasal sinus Yes  . Otalgia, unspecified laterality   . Pain of right thumb   . Thumb swelling        Plan:     Discussed her concerns, both acute and chronic allergy and sinus issues.  Discussed limitations of virtual consult.  I advised that cannot rule out Covid infection.  I advise she go get a Covid test.  Discussed quarantine until result available.  Begin round of Amoxicillin.  Discussed using a daily allergy regimen for chronic sinus and allergy issues.  Begin over-the-counter Flonase and Allegra daily.  Use nasal saline flush daily.  Recheck in the next month.  If not improving at that time then consider ENT consult or sinus CT.  Right thumb pain and  swelling- she will go for x-ray, we discussed using an over-the-counter splint in the meantime.  We discussed the limitations of virtual consult   Halie was seen today for sinus problem, ear pain and hand pain.  Diagnoses and all orders for this visit:  Chronic congestion of paranasal sinus  Otalgia, unspecified laterality  Pain of right thumb -     DG Finger Thumb Right; Future  Thumb swelling -     DG Finger Thumb Right; Future  Other orders -     amoxicillin (AMOXIL) 875 MG tablet; Take 1 tablet (875 mg total) by mouth 2 (two) times daily.  f/u prn

## 2020-03-02 ENCOUNTER — Ambulatory Visit: Payer: 59

## 2020-03-10 ENCOUNTER — Ambulatory Visit (INDEPENDENT_AMBULATORY_CARE_PROVIDER_SITE_OTHER): Payer: 59 | Admitting: *Deleted

## 2020-03-10 ENCOUNTER — Other Ambulatory Visit: Payer: Self-pay

## 2020-03-10 ENCOUNTER — Other Ambulatory Visit (HOSPITAL_COMMUNITY)
Admission: RE | Admit: 2020-03-10 | Discharge: 2020-03-10 | Disposition: A | Discharge status: Home / Self Care | Payer: 59 | Source: Ambulatory Visit | Attending: Obstetrics and Gynecology | Admitting: Obstetrics and Gynecology

## 2020-03-10 ENCOUNTER — Encounter: Payer: Self-pay | Admitting: *Deleted

## 2020-03-10 VITALS — BP 130/58 | HR 87 | Ht 67.0 in | Wt 199.6 lb

## 2020-03-10 DIAGNOSIS — N898 Other specified noninflammatory disorders of vagina: Secondary | ICD-10-CM

## 2020-03-10 DIAGNOSIS — Z9189 Other specified personal risk factors, not elsewhere classified: Secondary | ICD-10-CM

## 2020-03-10 DIAGNOSIS — Z113 Encounter for screening for infections with a predominantly sexual mode of transmission: Secondary | ICD-10-CM

## 2020-03-10 NOTE — Progress Notes (Addendum)
Pt states she has had a bump on her Rt labia x2 weeks. The area was swollen and red however did not have any pus or fluid. She states the swelling has reduced in size but she still feels the bump present. Pt also requests testing for STI including RPR and HIV due to recent unprotected sex. Self swab obtained. Terri Burleson in to examine pt. She stated that the area in question on pt's labia was not of urgent concern - most likely folliculitis. Pt will be notified of test results via MyChart. She voiced understanding of all information given.   Chart reviewed for nurse visit. Agree with plan of care.   Currie Paris, NP 03/10/2020 5:03 PM

## 2020-03-11 ENCOUNTER — Other Ambulatory Visit: Payer: Self-pay | Admitting: Nurse Practitioner

## 2020-03-11 DIAGNOSIS — B9689 Other specified bacterial agents as the cause of diseases classified elsewhere: Secondary | ICD-10-CM

## 2020-03-11 LAB — CERVICOVAGINAL ANCILLARY ONLY
Bacterial Vaginitis (gardnerella): POSITIVE — AB
Candida Glabrata: NEGATIVE
Candida Vaginitis: NEGATIVE
Chlamydia: NEGATIVE
Comment: NEGATIVE
Comment: NEGATIVE
Comment: NEGATIVE
Comment: NEGATIVE
Comment: NEGATIVE
Comment: NORMAL
Neisseria Gonorrhea: NEGATIVE
Trichomonas: NEGATIVE

## 2020-03-11 LAB — RPR: RPR Ser Ql: NONREACTIVE

## 2020-03-11 LAB — HIV ANTIBODY (ROUTINE TESTING W REFLEX): HIV Screen 4th Generation wRfx: NONREACTIVE

## 2020-03-11 MED ORDER — METRONIDAZOLE 500 MG PO TABS: 500.0000 mg | ORAL_TABLET | Freq: Two times a day (BID) | ORAL | 0 refills | Status: DC

## 2020-03-31 ENCOUNTER — Ambulatory Visit: Payer: 59 | Admitting: Student

## 2020-04-21 ENCOUNTER — Encounter: Payer: Self-pay | Admitting: Family Medicine

## 2020-04-21 ENCOUNTER — Ambulatory Visit: Payer: 59 | Admitting: Obstetrics and Gynecology

## 2020-05-20 ENCOUNTER — Telehealth (INDEPENDENT_AMBULATORY_CARE_PROVIDER_SITE_OTHER): Payer: 59 | Admitting: Family Medicine

## 2020-05-20 ENCOUNTER — Encounter: Payer: Self-pay | Admitting: Family Medicine

## 2020-05-20 ENCOUNTER — Other Ambulatory Visit: Payer: 59

## 2020-05-20 ENCOUNTER — Other Ambulatory Visit: Payer: Self-pay

## 2020-05-20 VITALS — Temp 101.2°F | Wt 198.0 lb

## 2020-05-20 DIAGNOSIS — R059 Cough, unspecified: Secondary | ICD-10-CM

## 2020-05-20 DIAGNOSIS — R509 Fever, unspecified: Secondary | ICD-10-CM

## 2020-05-20 DIAGNOSIS — R05 Cough: Secondary | ICD-10-CM

## 2020-05-20 DIAGNOSIS — J0140 Acute pansinusitis, unspecified: Secondary | ICD-10-CM

## 2020-05-20 DIAGNOSIS — H9203 Otalgia, bilateral: Secondary | ICD-10-CM

## 2020-05-20 LAB — POC COVID19 BINAXNOW: SARS Coronavirus 2 Ag: NEGATIVE

## 2020-05-20 LAB — POCT INFLUENZA A/B
Influenza A, POC: NEGATIVE
Influenza B, POC: NEGATIVE

## 2020-05-20 MED ORDER — SULFAMETHOXAZOLE-TRIMETHOPRIM 800-160 MG PO TABS: 1.0000 | ORAL_TABLET | Freq: Two times a day (BID) | ORAL | 0 refills | Status: DC

## 2020-05-20 NOTE — Progress Notes (Signed)
   Subjective:  Documentation for virtual audio and video telecommunications through [facility] encounter:  She came to the office for Covid testing.   The patient was located at home. 2 patient identifiers used.  The provider was located in the office. The patient did consent to this visit and is aware of possible charges through their insurance for this visit.  The other persons participating in this telemedicine service were none. Time spent on call was 10 minutes and in review of previous records 15 minutes total.  This virtual service is not related to other E/M service within previous 7 days.   Patient ID: Pamela [name], female    DOB: 1976/04/25, 44 y.o.   MRN: [id number]  HPI Chief Complaint  Patient presents with  . sinus infection    sinus infection and ear infection. started friday or saturday, cough, fever    Complains of a 6 day history of fever, body aches, sinus pain, ear pain and cough. States she initially had a frontal headache as well.  Hx of sinusitis.  Denies cough, shortness of breath, abdominal pain, N/V/D.   She is a smoker.   She did not get her Covid vaccine    Review of Systems Pertinent positives and negatives in the history of present illness.     Objective:   Physical Exam Temp (!) 101.2 F (38.4 C)   Wt 198 lb (89.8 kg)   BMI 31.01 kg/m   Alert and oriented and in no distress. Respirations unlabored. Normal speech      Assessment & Plan:  Fever, unspecified fever cause - Plan: Influenza A/B, POC COVID-19, Novel Coronavirus, NAA ([facility]), sulfamethoxazole-trimethoprim (BACTRIM DS) 800-160 MG tablet  Cough - Plan: Influenza A/B, POC COVID-19, Novel Coronavirus, NAA ([facility])  Acute pansinusitis, recurrence not specified - Plan: sulfamethoxazole-trimethoprim (BACTRIM DS) 800-160 MG tablet  Otalgia of both ears  Negative Covid test and flu.  PCR sent  Bactrim prescribed due to allergies.  Discussed symptomatic treatment including  Mucinex or Robitussin.  Follow up if worsening or not back to baseline.

## 2020-05-22 LAB — SARS-COV-2, NAA 2 DAY TAT

## 2020-05-22 LAB — NOVEL CORONAVIRUS, NAA: SARS-CoV-2, NAA: NOT DETECTED

## 2020-09-16 ENCOUNTER — Telehealth (INDEPENDENT_AMBULATORY_CARE_PROVIDER_SITE_OTHER): Payer: 59 | Admitting: Medical

## 2020-09-16 ENCOUNTER — Other Ambulatory Visit: Payer: Self-pay

## 2020-09-16 ENCOUNTER — Encounter: Payer: Self-pay | Admitting: Medical

## 2020-09-16 VITALS — Ht 67.0 in | Wt 189.0 lb

## 2020-09-16 DIAGNOSIS — Z20822 Contact with and (suspected) exposure to covid-19: Secondary | ICD-10-CM

## 2020-09-16 NOTE — Patient Instructions (Signed)
[facility] [phone]  General recommendations if you have respiratory symptoms: We recommend you rest, hydrate well with water and clear fluids throughout the day such as water, soup broth, ice chips, or possibly pedialyte or G2 no sugar gatorade.   You can use Tylenol over the counter for pain or fever every 4 - 6 hours You can use over the counter Delsym or mucinex DM for cough unless your provider prescribed a cough medication already. You can use over the counter Emetrol over the counter for nausea.    Consider EmergenC Immune plus vitamin pack over the counter which contains extra vitamin C, vitamin D, and zinc.  If you are having trouble breathing, if you are very weak, have high fever 103 or higher consistently despite Tylenol, or uncontrollable nausea and vomiting, then call or go to the emergency department.    Covid symptoms such as fatigue and cough can linger over 2 weeks, even after the initial fever, aches, chills, and other initial symptoms.   Self Quarantine and Isolation: Log onto CDC website for up to date quarantine recommendations.   https://www.cdc.gov/media/releases/2021/s1227-isolation-quarantine-guidance.html   If you test Covid +, regardless of vaccination status:  Stay home for 5 days. If you have no symptoms or your symptoms are resolving after 5 days, then you can leave your house. Continue to wear a mask around others for 5 additional days. If you have a fever, continue to stay home until your fever resolves.  This could take 7-10 days from onset of symptoms or longer in some cases. If you have lots of coughing, sneezing, and significant runny nose and congestion, continue to stay at home until your symptoms are resolving   If you were exposed to someone with Covid: If you: Have been boosted OR Completed the primary series of Pfizer or Moderna vaccine within the last 6 months OR Completed the primary series of J&J vaccine within the  last 2 months  Wear a mask around others for 10 days.  Test on day 5, if possible.   If you test positive on day 5 or more after exposure, and no symptoms, then wear a mask around others for 10 days  If you test positive and have symptoms, then follow the positive covid result isolation recommendations above If you test negative and have no symptoms on day 5 after exposure, then you may end isolation and be around others    If you were exposed to someone with Covid: If you: Completed the primary series of Pfizer or Moderna vaccine over 6 months ago and are not boosted OR Completed the primary series of J&J over 2 months ago and are not boosted OR Are unvaccinated  Stay home for 5 days. After that continue to wear a mask around others for 5 additional days. If you can't quarantine you must wear a mask for 10 days. Test on day 5 if possible. If you test positive on day 5 or more after exposure, and no symptoms, then wear a mask around others for 10 days  If you test positive and have symptoms, then follow the positive covid result isolation recommendations above If you test negative and have no symptoms on day 5 after exposure, then you can end isolation but wear a mask around others for 5 more days   If you test Covid negative, but have respiratory symptoms:  Continue to wear a mask around others for 5 additional days. If you have a fever, continue to stay home until your   fever resolves.   If you have lots of coughing, sneezing, and significant runny nose and congestion, continue to stay at home until your symptoms are resolving   Isolation means avoiding contact with people as much as possible.   Particularly in your house, isolate your self from others in a separate room, wear a mask when possible in the room, particularly if coughing a lot.   Have others bring food, water, medications, etc., to your door, but avoid direct contact with your household contacts during this time to  avoid spreading the infection to them.   If you have a separate bathroom and living quarters during the next 2 weeks away from others, that would be preferable.    If you can't completely isolate, then wear a mask, wash hands frequently with soap and water for at least 15 seconds, minimize close contact with others, and have a friend or family member check regularly from a distance to make sure you are not getting seriously worse.     You should not be going out in public, should not be going to stores, to work or other public places until all your symptoms have resolved.  One of the goals is to limit spread to high risk people; people that are older and elderly, people with multiple health issues like diabetes, heart disease, lung disease, and anybody that has weakened immune systems such as people with cancer or on immunosuppressive therapy.

## 2020-09-16 NOTE — Progress Notes (Signed)
Subjective:     Patient ID: Pamela [name], female   DOB: December 21, 1975, 45 y.o.   MRN: [id number]  This visit type was conducted due to national recommendations for restrictions regarding the COVID-19 Pandemic (e.g. social distancing) in an effort to limit this patient's exposure and mitigate transmission in our community.  Due to their co-morbid illnesses, this patient is at least at moderate risk for complications without adequate follow up.  This format is felt to be most appropriate for this patient at this time.    Documentation for virtual audio and video telecommunications through [place] encounter:  The patient was located at home. The provider was located in the office. The patient did consent to this visit and is aware of possible charges through their insurance for this visit.  The other persons participating in this telemedicine service were none. Time spent on call was 20 minutes and in review of previous records 20 minutes total.  This virtual service is not related to other E/M service within previous 7 days.   HPI Chief Complaint  Patient presents with  . Covid Exposure    Patient was exposed to covid on this week. Son was positive. Patient not having any symptoms.    Virtual consult due to exposure.  She is asymptomatic.  Her son who is 6yo tested positive yesterday with rapid at home test.  He has had symptoms 2 days.   Pamela [name] is asymptomatic.  She and son have been wearing mask out and about in the car, but not in the house.    She has not had the vaccine  No other aggravating or relieving factors. No other complaint.   Past Medical History:  Diagnosis Date  . Allergy    RHINITIS  . Diabetes mellitus   . Gestational diabetes    diet controlled  . Recurrent sinus infections   . Thyroid disease    THYROID CYST  . Vaginal Pap smear, abnormal     Review of Systems As in subjective    Objective:   Physical Exam Due to coronavirus pandemic stay at home  measures, patient visit was virtual and they were not examined in person.   Ht 5\' 7"  (1.702 m)   Wt 189 lb (85.7 kg)   BMI 29.60 kg/m   Gen: nad No obvious shortness of breath or wheezing Answers questions in complete sentences     Assessment:     Encounter Diagnosis  Name Primary?  . Close exposure to COVID-19 virus Yes       Plan:     We discussed her concerns.  She will come in Friday which will be day 5 since exposure per CDC guidelines.  She is asymptomatic and has not had the vaccine for COVID.  We discussed the following recommendations:   [facility] [phone]  General recommendations if you have respiratory symptoms: We recommend you rest, hydrate well with water and clear fluids throughout the day such as water, soup broth, ice chips, or possibly pedialyte or G2 no sugar gatorade.   You can use Tylenol over the counter for pain or fever every 4 - 6 hours You can use over the counter Delsym or mucinex DM for cough unless your provider prescribed a cough medication already. You can use over the counter Emetrol over the counter for nausea.    Consider EmergenC Immune plus vitamin pack over the counter which contains extra vitamin C, vitamin D, and zinc.  If you are having trouble breathing,  if you are very weak, have high fever 103 or higher consistently despite Tylenol, or uncontrollable nausea and vomiting, then call or go to the emergency department.    Covid symptoms such as fatigue and cough can linger over 2 weeks, even after the initial fever, aches, chills, and other initial symptoms.   Self Quarantine and Isolation: Log onto [employer] for up to date quarantine recommendations.   [email or web address]   If you test Covid +, regardless of vaccination status:  Stay home for 5 days. If you have no symptoms or your symptoms are resolving after 5 days, then you can leave your  house. Continue to wear a mask around others for 5 additional days. If you have a fever, continue to stay home until your fever resolves.  This could take 7-10 days from onset of symptoms or longer in some cases. If you have lots of coughing, sneezing, and significant runny nose and congestion, continue to stay at home until your symptoms are resolving   If you were exposed to someone with Covid: If you: Have been boosted OR Completed the primary series of Pfizer or Moderna vaccine within the last 6 months OR Completed the primary series of J&J vaccine within the last 2 months  Wear a mask around others for 10 days.  Test on day 5, if possible.   If you test positive on day 5 or more after exposure, and no symptoms, then wear a mask around others for 10 days  If you test positive and have symptoms, then follow the positive covid result isolation recommendations above If you test negative and have no symptoms on day 5 after exposure, then you may end isolation and be around others    If you were exposed to someone with Covid: If you: Completed the primary series of Pfizer or Moderna vaccine over 6 months ago and are not boosted OR Completed the primary series of J&J over 2 months ago and are not boosted OR Are unvaccinated  Stay home for 5 days. After that continue to wear a mask around others for 5 additional days. If you can't quarantine you must wear a mask for 10 days. Test on day 5 if possible. If you test positive on day 5 or more after exposure, and no symptoms, then wear a mask around others for 10 days  If you test positive and have symptoms, then follow the positive covid result isolation recommendations above If you test negative and have no symptoms on day 5 after exposure, then you can end isolation but wear a mask around others for 5 more days   If you test Covid negative, but have respiratory symptoms:  Continue to wear a mask around others for 5 additional  days. If you have a fever, continue to stay home until your fever resolves.   If you have lots of coughing, sneezing, and significant runny nose and congestion, continue to stay at home until your symptoms are resolving   Isolation means avoiding contact with people as much as possible.   Particularly in your house, isolate your self from others in a separate room, wear a mask when possible in the room, particularly if coughing a lot.   Have others bring food, water, medications, etc., to your door, but avoid direct contact with your household contacts during this time to avoid spreading the infection to them.   If you have a separate bathroom and living quarters during the next 2 weeks away from others, that would  be preferable.    If you can't completely isolate, then wear a mask, wash hands frequently with soap and water for at least 15 seconds, minimize close contact with others, and have a friend or family member check regularly from a distance to make sure you are not getting seriously worse.     You should not be going out in public, should not be going to stores, to work or other public places until all your symptoms have resolved.  One of the goals is to limit spread to high risk people; people that are older and elderly, people with multiple health issues like diabetes, heart disease, lung disease, and anybody that has weakened immune systems such as people with cancer or on immunosuppressive therapy.    Pamela [name] was seen today for covid exposure.  Diagnoses and all orders for this visit:  Close exposure to COVID-19 virus  Follow-up in our back parking lot Friday for Covid testing

## 2020-09-16 NOTE — Progress Notes (Signed)
Done

## 2020-09-18 ENCOUNTER — Other Ambulatory Visit: Payer: Self-pay

## 2020-09-18 ENCOUNTER — Other Ambulatory Visit (INDEPENDENT_AMBULATORY_CARE_PROVIDER_SITE_OTHER): Payer: 59

## 2020-09-18 DIAGNOSIS — R059 Cough, unspecified: Secondary | ICD-10-CM | POA: Diagnosis not present

## 2020-09-18 LAB — POC COVID19 BINAXNOW: SARS Coronavirus 2 Ag: NEGATIVE

## 2020-09-19 LAB — NOVEL CORONAVIRUS, NAA: SARS-CoV-2, NAA: NOT DETECTED

## 2020-09-21 LAB — NOVEL CORONAVIRUS, NAA: SARS-CoV-2, NAA: DETECTED — AB

## 2020-10-13 ENCOUNTER — Other Ambulatory Visit: Payer: Self-pay

## 2020-10-13 ENCOUNTER — Ambulatory Visit (INDEPENDENT_AMBULATORY_CARE_PROVIDER_SITE_OTHER): Payer: Self-pay | Admitting: General Practice

## 2020-10-13 ENCOUNTER — Other Ambulatory Visit (HOSPITAL_COMMUNITY)
Admission: RE | Admit: 2020-10-13 | Discharge: 2020-10-13 | Disposition: A | Discharge status: Home / Self Care | Payer: 59 | Source: Ambulatory Visit | Attending: Family Medicine | Admitting: Family Medicine

## 2020-10-13 VITALS — BP 117/78 | HR 87 | Ht 67.0 in | Wt 193.0 lb

## 2020-10-13 DIAGNOSIS — N898 Other specified noninflammatory disorders of vagina: Secondary | ICD-10-CM | POA: Diagnosis present

## 2020-10-13 NOTE — Progress Notes (Signed)
Patient was assessed and managed by nursing staff during this encounter. I have reviewed the chart and agree with the documentation and plan.   Bernerd Limbo, CNM 10/13/2020 1:37 PM

## 2020-10-13 NOTE — Progress Notes (Signed)
Patient presents to office today requesting self swab. Reports abnormal discharge with odor since last Thursday with vaginal irritation. Patient also reports prolonged cycle recently for 2 weeks. She is also requesting STD testing. Patient instructed in self swab and specimen collected. Discussed results take 24-48 hours to finalize and will be released to mychart and any needed prescription would be sent to your pharmacy. Patient verbalized understanding.  Chase Caller RN BSN 10/13/20

## 2020-10-14 LAB — CERVICOVAGINAL ANCILLARY ONLY
Bacterial Vaginitis (gardnerella): POSITIVE — AB
Candida Glabrata: NEGATIVE
Candida Vaginitis: NEGATIVE
Chlamydia: NEGATIVE
Comment: NEGATIVE
Comment: NEGATIVE
Comment: NEGATIVE
Comment: NEGATIVE
Comment: NEGATIVE
Comment: NORMAL
Neisseria Gonorrhea: NEGATIVE
Trichomonas: NEGATIVE

## 2020-10-15 ENCOUNTER — Other Ambulatory Visit: Payer: Self-pay | Admitting: Certified Nurse Midwife

## 2020-10-15 DIAGNOSIS — B9689 Other specified bacterial agents as the cause of diseases classified elsewhere: Secondary | ICD-10-CM

## 2020-10-15 MED ORDER — METRONIDAZOLE 0.75 % VA GEL: 1.0000 | Freq: Two times a day (BID) | VAGINAL | 0 refills | Status: DC

## 2020-10-15 NOTE — Progress Notes (Signed)
Metrogel order for BV infection

## 2020-10-19 ENCOUNTER — Ambulatory Visit (INDEPENDENT_AMBULATORY_CARE_PROVIDER_SITE_OTHER): Payer: 59 | Admitting: Family Medicine

## 2020-10-19 ENCOUNTER — Other Ambulatory Visit: Payer: Self-pay

## 2020-10-19 VITALS — BP 134/86 | HR 81 | Temp 98.3°F | Wt 198.0 lb

## 2020-10-19 DIAGNOSIS — M25561 Pain in right knee: Secondary | ICD-10-CM

## 2020-10-19 DIAGNOSIS — M25461 Effusion, right knee: Secondary | ICD-10-CM | POA: Diagnosis not present

## 2020-10-19 NOTE — Progress Notes (Signed)
   Subjective:    Patient ID: Pamela [name], female    DOB: May 21, 1976, 45 y.o.   MRN: [id number]  HPI She states that 1 week ago she put her foot down and did an internal rotation of her knee and felt some discomfort.  Since then she has noted some swelling and feeling of instability but no popping, locking or grinding.   Review of Systems     Objective:   Physical Exam Right knee exam does show an effusion.  She is tender to palpation over both medial lateral joint lines.  Anterior drawer negative.  Medial and lateral collateral ligaments intact.  McMurray's testing causes discomfort.       Assessment & Plan:  Effusion, right knee  Acute pain of right knee I will treat this conservatively with Aleve, heat and recheck here in 2 weeks.  She was comfortable with that.  I explained the fact that this is something that we would try to keep nonsurgical and as long as she improves will be fine.  Explained that we will probably end up referring her to physical therapy for rehab.

## 2020-10-19 NOTE — Patient Instructions (Signed)
Take 2 Aleve twice per day for the next 2 weeks.  Heat to your knee for 20 minutes 3 times per day

## 2020-11-03 ENCOUNTER — Ambulatory Visit: Payer: 59 | Admitting: Family Medicine

## 2020-11-06 ENCOUNTER — Other Ambulatory Visit: Payer: Self-pay

## 2020-11-06 ENCOUNTER — Ambulatory Visit (INDEPENDENT_AMBULATORY_CARE_PROVIDER_SITE_OTHER): Payer: 59 | Admitting: Family Medicine

## 2020-11-06 ENCOUNTER — Encounter: Payer: Self-pay | Admitting: Family Medicine

## 2020-11-06 VITALS — BP 142/92 | HR 81 | Temp 96.9°F | Wt 195.8 lb

## 2020-11-06 DIAGNOSIS — M25461 Effusion, right knee: Secondary | ICD-10-CM

## 2020-11-06 DIAGNOSIS — M25561 Pain in right knee: Secondary | ICD-10-CM

## 2020-11-06 NOTE — Progress Notes (Signed)
   Subjective:    Patient ID: Pamela [name], female    DOB: Jun 18, 1976, 45 y.o.   MRN: [id number]  HPI She is here for recheck.  She has been unable to use heat on her knee 3 times per day due to work-related issues.  She however states that she is roughly 50% better.   Review of Systems     Objective:   Physical Exam Right knee exam shows a minimal effusion.  No palpable tenderness.  Anterior drawer negative.  Medial and lateral collateral ligaments are normal.  Negative McMurray's testing.       Assessment & Plan:  Acute pain of right knee  Effusion, right knee Continue with conservative care and if still having difficulty after a month or so, I will refer to physical therapy.  She was comfortable with that.

## 2020-11-12 ENCOUNTER — Other Ambulatory Visit (HOSPITAL_COMMUNITY)
Admission: RE | Admit: 2020-11-12 | Discharge: 2020-11-12 | Disposition: A | Discharge status: Home / Self Care | Payer: 59 | Source: Ambulatory Visit | Attending: Student | Admitting: Student

## 2020-11-12 ENCOUNTER — Encounter: Payer: Self-pay | Admitting: Student

## 2020-11-12 ENCOUNTER — Other Ambulatory Visit: Payer: Self-pay

## 2020-11-12 ENCOUNTER — Ambulatory Visit (INDEPENDENT_AMBULATORY_CARE_PROVIDER_SITE_OTHER): Payer: 59 | Admitting: Student

## 2020-11-12 VITALS — BP 106/90 | HR 90 | Ht 61.0 in | Wt 198.1 lb

## 2020-11-12 DIAGNOSIS — Z01419 Encounter for gynecological examination (general) (routine) without abnormal findings: Secondary | ICD-10-CM | POA: Insufficient documentation

## 2020-11-12 DIAGNOSIS — F17200 Nicotine dependence, unspecified, uncomplicated: Secondary | ICD-10-CM | POA: Diagnosis not present

## 2020-11-12 MED ORDER — NORETHINDRONE 0.35 MG PO TABS: 1.0000 | ORAL_TABLET | Freq: Every day | ORAL | 11 refills | Status: DC

## 2020-11-13 DIAGNOSIS — F17200 Nicotine dependence, unspecified, uncomplicated: Secondary | ICD-10-CM | POA: Insufficient documentation

## 2020-11-13 LAB — CERVICOVAGINAL ANCILLARY ONLY
Bacterial Vaginitis (gardnerella): POSITIVE — AB
Candida Glabrata: NEGATIVE
Candida Vaginitis: NEGATIVE
Comment: NEGATIVE
Comment: NEGATIVE
Comment: NEGATIVE

## 2020-11-13 LAB — HEPATITIS B SURFACE ANTIGEN: Hepatitis B Surface Ag: NEGATIVE

## 2020-11-13 LAB — HEPATITIS C ANTIBODY: Hep C Virus Ab: <0.1 s/co ratio (ref 0.0–0.9)

## 2020-11-13 LAB — HIV ANTIBODY (ROUTINE TESTING W REFLEX): HIV Screen 4th Generation wRfx: NONREACTIVE

## 2020-11-13 LAB — RPR: RPR Ser Ql: NONREACTIVE

## 2020-11-13 NOTE — Progress Notes (Signed)
History:  [name] is a 45 y.o. [phone] who presents to clinic today for annual exam and STI testing. She is starting to have irregular periods; does not take birth control. Her last pap was in 2020 and she was supposed to have follow-up; she did not schedule appt.  Patient reports that she has a swollen bump on her right axilla, it has been there for 3 years. She does not know what it is. It comes and goes.   Patient reports that she is still smoking cigarettes occasionally. She is under a lot of stress, especially with her job and not always having a steady income. She has not started taking any birth control pills because she was worried about blood clots.   The following portions of the patient's history were reviewed and updated as appropriate: allergies, current medications, family history, past medical history, social history, past surgical history and problem list.  Review of Systems:  Review of Systems  Constitutional: Negative.   HENT: Negative.   Respiratory: Negative.   Cardiovascular: Negative.   Gastrointestinal: Negative.   Genitourinary: Negative.   Musculoskeletal: Negative.   Skin: Negative.       Objective:  Physical Exam BP 106/90 (BP Location: Left Arm, Patient Position: Sitting, Cuff Size: Large)   Pulse 90   Ht 5\' 1"  (1.549 m)   Wt 198 lb 1.6 oz (89.9 kg)   LMP 11/06/2020   BMI 37.43 kg/m  Physical Exam Constitutional:      Appearance: Normal appearance.  HENT:     Head: Normocephalic.  Cardiovascular:     Rate and Rhythm: Normal rate and regular rhythm.  Pulmonary:     Effort: Pulmonary effort is normal.     Breath sounds: Normal breath sounds.  Abdominal:     General: Abdomen is flat.  Genitourinary:    General: Normal vulva.     Vagina: No vaginal discharge.     Rectum: Guaiac result negative.     Comments: NEFG; no discharge in the vagina, no CMT, no odor, no tenderness over adnexa Musculoskeletal:        General: Normal range of  motion.  Neurological:     General: No focal deficit present.     Mental Status: She is alert.    Breast exam is normal: No lumps or masses, no pain. On right axilla is a swollen hair follicle, non-tender.    Labs and Imaging Results for orders placed or performed in visit on 11/12/20 (from the past 24 hour(s))  HIV Antibody (routine testing w rflx)     Status: None   Collection Time: 11/12/20  3:35 PM  Result Value Ref Range   HIV Screen 4th Generation wRfx Non Reactive Non Reactive   Narrative   Performed at:  [address] [place], [place]  [place] Lab Director: [id number] MD, Phone:  [phone]  RPR     Status: None   Collection Time: 11/12/20  3:35 PM  Result Value Ref Range   RPR Ser Ql Non Reactive Non Reactive   Narrative   Performed at:  [address] [facility] [address], [place], [place]  [place] Lab Director: [id number] MD, Phone:  [phone]  Hepatitis B Surface AntiGEN     Status: None   Collection Time: 11/12/20  3:35 PM  Result Value Ref Range   Hepatitis B Surface Ag Negative Negative   Narrative   Performed at:  01/12/21 - [facility] [address]  [address], [place], [place]  [id number] Lab Director: Jolene Schimke MD, Phone:  [phone]  Hepatitis C Antibody     Status: None   Collection Time: 11/12/20  3:35 PM  Result Value Ref Range   Hep C Virus Ab <0.1 0.0 - 0.9 s/co ratio   Narrative   Performed at:  [address] [place], [place]  [id number] Lab Director: Jolene Schimke MD, Phone:  [phone]    No results found.   Assessment & Plan:  1. Encounter for annual routine gynecological examination -Reassured patient that hair follicle seems benign; encouraged monthly breast exam. Encouraged smoking cessation -Encouraged patient to pick up new  micronor prescription as that is something that she can take while smoking (no estrogen). Patient understands difference between POPs and COC. She  does not desire pregnancy and will start pills. Explained how to take and importance of adherence.  - Cytology - PAP( [facility]) - HIV Antibody (routine testing w rflx) - RPR - Hepatitis B Surface AntiGEN - Hepatitis C Antibody - Cervicovaginal ancillary only( [facility]) - MM Digital Screening; Future    Approximately 30 minutes of total time was spent with this patient on counseling and coordination of care.   Marylene Land, CNM 11/13/2020 9:57 AM

## 2020-11-16 ENCOUNTER — Other Ambulatory Visit: Payer: Self-pay

## 2020-11-16 DIAGNOSIS — B9689 Other specified bacterial agents as the cause of diseases classified elsewhere: Secondary | ICD-10-CM

## 2020-11-16 LAB — CYTOLOGY - PAP
Adequacy: ABSENT
Chlamydia: NEGATIVE
Comment: NEGATIVE
Comment: NEGATIVE
Comment: NEGATIVE
Comment: NORMAL
Diagnosis: NEGATIVE
High risk HPV: NEGATIVE
Neisseria Gonorrhea: NEGATIVE
Trichomonas: NEGATIVE

## 2020-11-16 MED ORDER — METRONIDAZOLE 0.75 % EX GEL: 1.0000 "application " | Freq: Two times a day (BID) | CUTANEOUS | 0 refills | Status: DC

## 2020-11-17 ENCOUNTER — Other Ambulatory Visit: Payer: Self-pay

## 2020-11-17 ENCOUNTER — Ambulatory Visit (INDEPENDENT_AMBULATORY_CARE_PROVIDER_SITE_OTHER): Payer: 59 | Admitting: Medical

## 2020-11-17 ENCOUNTER — Encounter: Payer: Self-pay | Admitting: Medical

## 2020-11-17 ENCOUNTER — Ambulatory Visit: Payer: 59 | Admitting: Family Medicine

## 2020-11-17 VITALS — BP 126/74 | HR 95 | Ht 61.0 in | Wt 191.4 lb

## 2020-11-17 DIAGNOSIS — R21 Rash and other nonspecific skin eruption: Secondary | ICD-10-CM

## 2020-11-17 DIAGNOSIS — M7918 Myalgia, other site: Secondary | ICD-10-CM

## 2020-11-17 MED ORDER — SULFAMETHOXAZOLE-TRIMETHOPRIM 800-160 MG PO TABS
1.0000 | ORAL_TABLET | Freq: Two times a day (BID) | ORAL | 0 refills | Status: DC
Start: 2020-11-17 — End: 2021-01-20

## 2020-11-17 MED ORDER — VALACYCLOVIR HCL 1 G PO TABS: 1000.0000 mg | ORAL_TABLET | Freq: Three times a day (TID) | ORAL | 0 refills | Status: DC

## 2020-11-17 NOTE — Progress Notes (Signed)
Subjective:  Pamela [name] is a 45 y.o. female who presents for Chief Complaint  Patient presents with  . Insect Bite     Here for possible insect bite.  She notes a bruise appearing coloration appeared on her left buttock within the last several days.  She thinks it could be an insect bite but she is not sure.  She has had some issues with spiders in her house.  She feels like there could be a knot under the skin.  She denies fever, body aches, chills, nausea, vomiting.  Otherwise has been normal state of health  No recent contact with others with skin infection.  No history of shingles.  No other aggravating or relieving factors.    No other c/o.  The following portions of the patient's history were reviewed and updated as appropriate: allergies, current medications, past family history, past medical history, past social history, past surgical history and problem list.  ROS Otherwise as in subjective above    Objective: BP 126/74   Pulse 95   Ht 5\' 1"  (1.549 m)   Wt 191 lb 6.4 oz (86.8 kg)   LMP 11/06/2020   SpO2 95%   BMI 36.16 kg/m   General appearance: alert, no distress, well developed, well nourished, African-American female Left buttock somewhat medial and superior with about a 7 to 8 cm x 6 cm somewhat oval horizontal oriented patch of red purplish coloration The whole area is tender.  There are no vesicles.  There is no specific induration but it is a little bit more dense centrally.  No warmth no fluctuation, otherwise buttocks and leg unremarkable Exam chaperoned with nurse   Assessment: Encounter Diagnoses  Name Primary?  . Rash Yes  . Pain in buttock      Plan: We discussed that currently it may just be too early to know how this skin finding is going to blossom.  There is some features that suggest possible early shingles, other features that suggest possibly early cellulitis/skin infection possibly due to bite but no obvious bite marks seen  To cover  either scenario I will have her begin Valtrex for the potential for shingles, Bactrim for the potential for bite and subsequent infection that is brewing.  She will use ibuprofen over-the-counter for pain as that has been helping  I asked her to call, return, or send pictures to my chart in the next 24 to 48 hours as I suspect this rash will change in that timeframe which would help in the diagnosis  Otherwise if any other new findings, new concerns or worsening then call or recheck right away  Discussed risk and benefits and proper use of medication  [place] was seen today for insect bite.  Diagnoses and all orders for this visit:  Rash  Pain in buttock  Other orders -     valACYclovir (VALTREX) 1000 MG tablet; Take 1 tablet (1,000 mg total) by mouth 3 (three) times daily. -     sulfamethoxazole-trimethoprim (BACTRIM DS) 800-160 MG tablet; Take 1 tablet by mouth 2 (two) times daily.    Follow up: call report 24-48 hours

## 2020-11-19 ENCOUNTER — Other Ambulatory Visit: Payer: Self-pay

## 2020-11-19 ENCOUNTER — Ambulatory Visit
Admission: RE | Admit: 2020-11-19 | Discharge: 2020-11-19 | Disposition: A | Discharge status: Home / Self Care | Payer: 59 | Source: Ambulatory Visit | Attending: Student | Admitting: Student

## 2020-11-19 DIAGNOSIS — Z01419 Encounter for gynecological examination (general) (routine) without abnormal findings: Secondary | ICD-10-CM

## 2021-01-19 ENCOUNTER — Encounter (HOSPITAL_COMMUNITY): Payer: Self-pay

## 2021-01-19 ENCOUNTER — Other Ambulatory Visit: Payer: Self-pay

## 2021-01-19 ENCOUNTER — Emergency Department (HOSPITAL_COMMUNITY)
Admission: EM | Admit: 2021-01-19 | Discharge: 2021-01-20 | Disposition: A | Discharge status: Home / Self Care | Payer: 59 | Attending: Emergency Medicine | Admitting: Emergency Medicine

## 2021-01-19 DIAGNOSIS — E876 Hypokalemia: Secondary | ICD-10-CM | POA: Insufficient documentation

## 2021-01-19 DIAGNOSIS — R112 Nausea with vomiting, unspecified: Secondary | ICD-10-CM

## 2021-01-19 DIAGNOSIS — U071 COVID-19: Secondary | ICD-10-CM | POA: Diagnosis not present

## 2021-01-19 DIAGNOSIS — R197 Diarrhea, unspecified: Secondary | ICD-10-CM

## 2021-01-19 DIAGNOSIS — F17210 Nicotine dependence, cigarettes, uncomplicated: Secondary | ICD-10-CM | POA: Insufficient documentation

## 2021-01-19 DIAGNOSIS — E119 Type 2 diabetes mellitus without complications: Secondary | ICD-10-CM | POA: Insufficient documentation

## 2021-01-19 DIAGNOSIS — R519 Headache, unspecified: Secondary | ICD-10-CM

## 2021-01-19 MED ORDER — ACETAMINOPHEN 325 MG PO TABS
650.0000 mg | ORAL_TABLET | Freq: Once | ORAL | Status: AC
  Administered 2021-01-19: 650 mg via ORAL
  Filled 2021-01-19: qty 2

## 2021-01-19 NOTE — ED Triage Notes (Signed)
Pt c/o generalized body aches starting at 4pm today. Also c/o cough, n/v/d. Took a friends muscle relaxer around 730p without relief.

## 2021-01-19 NOTE — ED Provider Notes (Signed)
Emergency Medicine Provider Triage Evaluation Note  Pamela [name] , a 45 y.o. female  was evaluated in triage.  Pt complains of body aches, chills.  Onset around 4 PM today.  Complaining of cough, nausea, nonbloody nonbilious vomiting.  Generalized abdominal pain.  Not vaccinated for COVID.  Noted to be febrile in triage.  No other sick contacts.  No chest pain, shortness of breath  Review of Systems  Positive: As above Negative: As above  Physical Exam  BP (!) 147/67 (BP Location: Right Arm)   Pulse (!) 124   Temp (!) 100.5 F (38.1 C) (Oral)   Resp 18   Ht 5\' 7"  (1.702 m)   Wt 85.7 kg   LMP 01/12/2021 (Within Days)   SpO2 100%   BMI 29.60 kg/m  Gen:   Awake, no distress   Resp:  Normal effort  MSK:   Moves extremities without difficulty  Other:    Medical Decision Making  Medically screening exam initiated at 9:19 PM.  Appropriate orders placed.  Pamela [name] was informed that the remainder of the evaluation will be completed by another provider, this initial triage assessment does not replace that evaluation, and the importance of remaining in the ED until their evaluation is complete.     Colette Ribas 01/19/21 2120    2121, MD 01/19/21 2322

## 2021-01-19 NOTE — ED Notes (Signed)
Pt temperature rechecked, when rechecking temperature pt had 2 hot packs behind her back and 2 blankets on her. Blankets and hot packs removed and temperature will be rechecked

## 2021-01-20 LAB — CBC WITH DIFFERENTIAL/PLATELET
Abs Immature Granulocytes: 0.04 10*3/uL (ref 0.00–0.07)
Basophils Absolute: 0 10*3/uL (ref 0.0–0.1)
Basophils Relative: 0 %
Eosinophils Absolute: 0 10*3/uL (ref 0.0–0.5)
Eosinophils Relative: 0 %
HCT: 35.4 % — ABNORMAL LOW (ref 36.0–46.0)
Hemoglobin: 12 g/dL (ref 12.0–15.0)
Immature Granulocytes: 1 %
Lymphocytes Relative: 7 %
Lymphs Abs: 0.5 10*3/uL — ABNORMAL LOW (ref 0.7–4.0)
MCH: 28.8 pg (ref 26.0–34.0)
MCHC: 33.9 g/dL (ref 30.0–36.0)
MCV: 84.9 fL (ref 80.0–100.0)
Monocytes Absolute: 0.9 10*3/uL (ref 0.1–1.0)
Monocytes Relative: 12 %
Neutro Abs: 6.3 10*3/uL (ref 1.7–7.7)
Neutrophils Relative %: 80 %
Platelets: 314 10*3/uL (ref 150–400)
RBC: 4.17 MIL/uL (ref 3.87–5.11)
RDW: 13.4 % (ref 11.5–15.5)
WBC: 7.8 10*3/uL (ref 4.0–10.5)
nRBC: 0 % (ref 0.0–0.2)

## 2021-01-20 LAB — BASIC METABOLIC PANEL
Anion gap: 8 (ref 5–15)
BUN: 8 mg/dL (ref 6–20)
CO2: 21 mmol/L — ABNORMAL LOW (ref 22–32)
Calcium: 8.9 mg/dL (ref 8.9–10.3)
Chloride: 107 mmol/L (ref 98–111)
Creatinine, Ser: 0.61 mg/dL (ref 0.44–1.00)
GFR, Estimated: >60 mL/min (ref >60)
Glucose, Bld: 136 mg/dL — ABNORMAL HIGH (ref 70–99)
Potassium: 3.3 mmol/L — ABNORMAL LOW (ref 3.5–5.1)
Sodium: 136 mmol/L (ref 135–145)

## 2021-01-20 LAB — URINALYSIS, ROUTINE W REFLEX MICROSCOPIC
Bilirubin Urine: NEGATIVE
Glucose, UA: NEGATIVE mg/dL
Ketones, ur: NEGATIVE mg/dL
Leukocytes,Ua: NEGATIVE
Nitrite: NEGATIVE
Protein, ur: NEGATIVE mg/dL
Specific Gravity, Urine: 1.003 — ABNORMAL LOW (ref 1.005–1.030)
pH: 7 (ref 5.0–8.0)

## 2021-01-20 LAB — I-STAT BETA HCG BLOOD, ED (MC, WL, AP ONLY): I-stat hCG, quantitative: <5 m[IU]/mL (ref <5)

## 2021-01-20 LAB — RESP PANEL BY RT-PCR (FLU A&B, COVID) ARPGX2
Influenza A by PCR: NEGATIVE
Influenza B by PCR: NEGATIVE
SARS Coronavirus 2 by RT PCR: POSITIVE — AB

## 2021-01-20 MED ORDER — NIRMATRELVIR/RITONAVIR (PAXLOVID)TABLET: 3.0000 | ORAL_TABLET | Freq: Two times a day (BID) | ORAL | 0 refills | Status: AC

## 2021-01-20 MED ORDER — ONDANSETRON HCL 4 MG/2ML IJ SOLN
4.0000 mg | Freq: Once | INTRAMUSCULAR | Status: AC
  Administered 2021-01-20: 4 mg via INTRAVENOUS
  Filled 2021-01-20: qty 2

## 2021-01-20 MED ORDER — LACTATED RINGERS IV BOLUS
1000.0000 mL | Freq: Once | INTRAVENOUS | Status: AC
  Administered 2021-01-20: 1000 mL via INTRAVENOUS

## 2021-01-20 MED ORDER — POTASSIUM CHLORIDE CRYS ER 20 MEQ PO TBCR
40.0000 meq | EXTENDED_RELEASE_TABLET | Freq: Once | ORAL | Status: AC
  Administered 2021-01-20: 40 meq via ORAL
  Filled 2021-01-20: qty 2

## 2021-01-20 MED ORDER — ACETAMINOPHEN 325 MG PO TABS
650.0000 mg | ORAL_TABLET | Freq: Once | ORAL | Status: AC
  Administered 2021-01-20: 650 mg via ORAL
  Filled 2021-01-20: qty 2

## 2021-01-20 MED ORDER — ONDANSETRON HCL 4 MG PO TABS: 4.0000 mg | ORAL_TABLET | Freq: Four times a day (QID) | ORAL | 0 refills | Status: DC | PRN

## 2021-01-20 MED ORDER — POTASSIUM CHLORIDE CRYS ER 20 MEQ PO TBCR: 20.0000 meq | EXTENDED_RELEASE_TABLET | Freq: Two times a day (BID) | ORAL | 0 refills | Status: DC

## 2021-01-20 MED ORDER — KETOROLAC TROMETHAMINE 30 MG/ML IJ SOLN
30.0000 mg | Freq: Once | INTRAMUSCULAR | Status: AC
  Administered 2021-01-20: 30 mg via INTRAVENOUS
  Filled 2021-01-20: qty 1

## 2021-01-20 MED ORDER — PROCHLORPERAZINE EDISYLATE 10 MG/2ML IJ SOLN
10.0000 mg | Freq: Once | INTRAMUSCULAR | Status: AC
  Administered 2021-01-20: 10 mg via INTRAVENOUS
  Filled 2021-01-20: qty 2

## 2021-01-20 NOTE — ED Provider Notes (Signed)
[facility]-EMERGENCY DEPT Provider Note   CSN: [id number] Arrival date & time: 01/19/21  2014     History Chief Complaint  Patient presents with  . Generalized Body Aches    Pamela [name] is a 45 y.o. female.  The history is provided by the patient.  She has history of diabetes and comes in because of vomiting, diarrhea, body aches.  She started getting sick this afternoon with subjective fever with associated chills and sweats.  She has vomited about 5 times and had 2 watery bowel movements.  She continues to have nausea but does not have any more sense of rectal urgency.  She has had urinary frequency as well as urgency and is concerned about possible urinary tract infection.  She has had generalized body aches which she rates at 10/10.  She denies any sick contacts and is concerned that she may have eaten some tainted [place] food.  She has not done anything to treat her symptoms.   Past Medical History:  Diagnosis Date  . Allergy    RHINITIS  . Diabetes mellitus   . Gestational diabetes    diet controlled  . Recurrent sinus infections   . Thyroid disease    THYROID CYST  . Vaginal Pap smear, abnormal     Patient Active Problem List   Diagnosis Date Noted  . Rash 11/17/2020  . Pain in buttock 11/17/2020  . Current smoker 11/13/2020  . Otalgia 02/06/2020  . Chronic congestion of paranasal sinus 02/06/2020  . Pain of right thumb 02/06/2020  . Thumb swelling 02/06/2020  . Pap smear of cervix shows high risk HPV present 06/11/2019  . Dysmenorrhea 08/08/2017    Past Surgical History:  Procedure Laterality Date  . COLPOSCOPY  2004     OB History    Gravida  5   Para  3   Term  3   Preterm      AB  1   Living  3     SAB      IAB  1   Ectopic      Multiple      Live Births  3           Family History  Problem Relation Age of Onset  . Diabetes Father   . Heart disease Father   . Hypertension Father   . Prostate cancer  Father   . Stroke Father   . Asthma Sister   . Hypertension Sister   . Arthritis Paternal Grandmother   . Hypertension Paternal Grandmother   . Diabetes Paternal Grandmother   . Stroke Paternal Grandmother   . Arthritis Paternal Grandfather   . Hypertension Paternal Grandfather   . Multiple sclerosis Mother     Social History   Tobacco Use  . Smoking status: Current Every Day Smoker    Types: Cigars    Last attempt to quit: 09/05/2006    Years since quitting: 14.3  . Smokeless tobacco: Never Used  . Tobacco comment: smoked socially for a few yrs  Substance Use Topics  . Alcohol use: Yes    Comment: socially  . Drug use: No    Home Medications Prior to Admission medications   Medication Sig Start Date End Date Taking? Authorizing Provider  metroNIDAZOLE (METROGEL VAGINAL) 0.75 % vaginal gel Place 1 Applicatorful vaginally 2 (two) times daily. Patient not taking: No sig reported 10/15/20   Edd Arbour R, CNM  metroNIDAZOLE (METROGEL) 0.75 % gel Apply 1 application  topically 2 (two) times daily. Patient not taking: Reported on 11/17/2020 11/16/20   Marylene Land, CNM  Multiple Vitamins-Minerals (WOMENS MULTI PO) Take by mouth.    [provider]  norethindrone (MICRONOR) 0.35 MG tablet Take 1 tablet (0.35 mg total) by mouth daily. Patient not taking: Reported on 11/17/2020 11/12/20   Marylene Land, CNM  sulfamethoxazole-trimethoprim (BACTRIM DS) 800-160 MG tablet Take 1 tablet by mouth 2 (two) times daily. 11/17/20   Tysinger, Kermit Balo, PA-C  valACYclovir (VALTREX) 1000 MG tablet Take 1 tablet (1,000 mg total) by mouth 3 (three) times daily. 11/17/20   Tysinger, Kermit Balo, PA-C    Allergies    Cephalexin  Review of Systems   Review of Systems  All other systems reviewed and are negative.   Physical Exam Updated Vital Signs BP (!) 109/95   Pulse (!) 106   Temp 100.2 F (37.9 C) (Oral)   Resp 20   Ht 5\' 7"  (1.702 m)   Wt 85.7 kg   LMP  01/12/2021 (Within Days)   SpO2 91%   BMI 29.60 kg/m   Physical Exam Vitals and nursing note reviewed.   45 year old female, resting comfortably and in no acute distress. Vital signs are significant for borderline elevated temperature and mildly elevated heart rate. Oxygen saturation is 91%, which is borderline low. Head is normocephalic and atraumatic. PERRLA, EOMI. Oropharynx is clear. Neck is nontender and supple without adenopathy or JVD. Back is nontender and there is no CVA tenderness. Lungs are clear without rales, wheezes, or rhonchi. Chest is nontender. Heart has regular rate and rhythm without murmur. Abdomen is soft, flat, nontender without masses or hepatosplenomegaly and peristalsis is hypoactive. Extremities have no cyanosis or edema, full range of motion is present. Skin is warm and dry without rash. Neurologic: Mental status is normal, cranial nerves are intact, there are no motor or sensory deficits.  ED Results / Procedures / Treatments   Labs (all labs ordered are listed, but only abnormal results are displayed) Labs Reviewed  RESP PANEL BY RT-PCR (FLU A&B, COVID) ARPGX2 - Abnormal; Notable for the following components:      Result Value   SARS Coronavirus 2 by RT PCR POSITIVE (*)    All other components within normal limits  BASIC METABOLIC PANEL - Abnormal; Notable for the following components:   Potassium 3.3 (*)    CO2 21 (*)    Glucose, Bld 136 (*)    All other components within normal limits  CBC WITH DIFFERENTIAL/PLATELET - Abnormal; Notable for the following components:   HCT 35.4 (*)    Lymphs Abs 0.5 (*)    All other components within normal limits  URINALYSIS, ROUTINE W REFLEX MICROSCOPIC - Abnormal; Notable for the following components:   Color, Urine COLORLESS (*)    Specific Gravity, Urine 1.003 (*)    Hgb urine dipstick MODERATE (*)    Bacteria, UA RARE (*)    All other components within normal limits  I-STAT BETA HCG BLOOD, ED ([facility], [facility], [facility]  ONLY)   Procedures Procedures   Medications Ordered in ED Medications  ondansetron (ZOFRAN) injection 4 mg (has no administration in time range)  lactated ringers bolus 1,000 mL (has no administration in time range)  acetaminophen (TYLENOL) tablet 650 mg (has no administration in time range)  acetaminophen (TYLENOL) tablet 650 mg (650 mg Oral Given 01/19/21 2239)    ED Course  I have reviewed the triage vital signs and the nursing notes.  Pertinent lab results that were available during my care of the patient were reviewed by me and considered in my medical decision making (see chart for details).   MDM Rules/Calculators/A&P                         Fever, vomiting, diarrhea strongly suggestive of viral gastroenteritis.  Possible food poisoning.  No red flags to suggest more serious pathology.  She will be given IV fluids, ondansetron, oral acetaminophen.  Will check screening labs.  Old records are reviewed, and she has no relevant past visits.  Labs show mild hypokalemia and she is given dose of oral potassium.  Respiratory pathogen panel is positive for COVID-19.  Patient states that nausea was much improved after above-noted treatment but she was complaining of headache.  She was given prochlorperazine and diphenhydramine with little change in headache.  She was then given dose of ketorolac with good relief of headache.  She is discharged with prescription for Paxlovid as well as ondansetron and K-Dur.  She needs to isolate because of positive COVID test.  Follow-up with PCP in 5 days.  Return precautions discussed.  Final Clinical Impression(s) / ED Diagnoses Final diagnoses:  Nausea vomiting and diarrhea  COVID-19 virus infection  Bad headache  Hypokalemia    Rx / DC Orders ED Discharge Orders         Ordered    ondansetron (ZOFRAN) 4 MG tablet  Every 6 hours PRN        01/20/21 0552    potassium chloride SA (KLOR-CON) 20 MEQ tablet  2 times daily        01/20/21 0552     nirmatrelvir/ritonavir EUA (PAXLOVID) TABS  2 times daily        01/20/21 0552           Dione Booze, MD 01/20/21 [phone]

## 2021-01-20 NOTE — Discharge Instructions (Signed)
Take loperamide (Imodium AD) as needed for diarrhea.  Take ibuprofen and/or acetaminophen as needed for fever or aching.  Return if symptoms are getting worse.

## 2021-01-25 ENCOUNTER — Encounter: Payer: Self-pay | Admitting: Family Medicine

## 2021-01-25 ENCOUNTER — Other Ambulatory Visit: Payer: Self-pay

## 2021-01-25 ENCOUNTER — Telehealth (INDEPENDENT_AMBULATORY_CARE_PROVIDER_SITE_OTHER): Payer: 59 | Admitting: Family Medicine

## 2021-01-25 VITALS — Ht 67.0 in | Wt 189.0 lb

## 2021-01-25 DIAGNOSIS — U071 COVID-19: Secondary | ICD-10-CM

## 2021-01-25 NOTE — Progress Notes (Signed)
   Subjective:    Patient ID: Pamela [name], female    DOB: 12-Jul-1976, 45 y.o.   MRN: [id number]  HPI Documentation for virtual audio and video telecommunications through [facility] encounter: The patient was located at home. 2 patient identifiers used.  The provider was located in the office. The patient did consent to this visit and is aware of possible charges through their insurance for this visit. The other persons participating in this telemedicine service were none. Time spent on call was 5 minutes and in review of previous records >20 minutes total for counseling and coordination of care. This virtual service is not related to other E/M service within previous 7 days. Last Tuesday she developed malaise, myalgias, headache followed by nausea, vomiting and a slight fever.  She did go to the emergency room.  COVID testing there was positive.  She was sent home after she was given fluids as well as potassium.  She is continued have difficulty with nausea and vomiting as well as headache.  She states that she is roughly 40% better.  She has not had the COVID-vaccine.  We did give her Zofran to help with her nausea. Review of Systems     Objective:   Physical Exam Alert and in no distress otherwise not examined       Assessment & Plan:  COVID-19 Recommend she continue to use the Zofran on an as-needed basis for the nausea, keep up the fluids.  Use Tylenol for aches and pains.  She is planning on taking the rest the week off which I agree.  She should be able to return to work next Monday.  Did recommend she wear a mask for another week or so while at work. I again discussed the need for vaccine later but she is still not interested.

## 2021-11-23 ENCOUNTER — Other Ambulatory Visit (HOSPITAL_COMMUNITY)
Admission: RE | Admit: 2021-11-23 | Discharge: 2021-11-23 | Disposition: A | Discharge status: Home / Self Care | Payer: 59 | Source: Ambulatory Visit | Attending: Family Medicine | Admitting: Family Medicine

## 2021-11-23 ENCOUNTER — Other Ambulatory Visit: Payer: Self-pay

## 2021-11-23 ENCOUNTER — Ambulatory Visit: Payer: Self-pay | Admitting: Family Medicine

## 2021-11-23 ENCOUNTER — Ambulatory Visit (INDEPENDENT_AMBULATORY_CARE_PROVIDER_SITE_OTHER): Payer: 59 | Admitting: Family Medicine

## 2021-11-23 ENCOUNTER — Encounter: Payer: Self-pay | Admitting: Family Medicine

## 2021-11-23 VITALS — BP 124/111 | HR 102 | Ht 67.0 in | Wt 184.9 lb

## 2021-11-23 DIAGNOSIS — Z01419 Encounter for gynecological examination (general) (routine) without abnormal findings: Secondary | ICD-10-CM | POA: Insufficient documentation

## 2021-11-23 NOTE — Progress Notes (Deleted)
? ? ?ANNUAL EXAM ?Patient name: Pamela [name] MRN [id number]  Date of birth: 1976-08-09 ?Chief Complaint:   ?No chief complaint on file. ? ?History of Present Illness:   ?Pamela [name] is a 46 y.o. [phone] {race:25618} female being seen today for a routine annual exam.  ?Current complaints: *** ? ?No LMP recorded. ? ? ?The pregnancy intention screening data noted above was reviewed. Potential methods of contraception were discussed. The patient elected to proceed with No data recorded.  ? ?Last pap ***. Results were: {Pap findings:25134}. H/O abnormal pap: {yes/yes***/no:23866} ?Last mammogram: ***. Results were: {normal, abnormal, n/a:23837}. Family h/o breast cancer: {yes***/no:23838} ?Last colonoscopy: ***. Results were: {normal, abnormal, n/a:23837}. Family h/o colorectal cancer: {yes***/no:23838} ? ?Depression screen [facility] 2/9 11/12/2020 03/10/2020 06/04/2019 10/17/2018 08/08/2017  ?Decreased Interest 0 0 0 0 1  ?Down, Depressed, Hopeless 0 0 0 0 2  ?PHQ - 2 Score 0 0 0 0 3  ?Altered sleeping 0 1 1 0 3  ?Tired, decreased energy 0 1 1 0 3  ?Change in appetite 0 0 0 0 3  ?Feeling bad or failure about yourself  0 0 0 0 0  ?Trouble concentrating 0 0 0 0 0  ?Moving slowly or fidgety/restless 0 0 0 0 0  ?Suicidal thoughts 0 0 0 0 0  ?PHQ-9 Score 0 2 2 0 12  ? ?  ?GAD 7 : Generalized Anxiety Score 11/12/2020 03/10/2020 06/04/2019 10/17/2018  ?Nervous, Anxious, on Edge 0 0 0 0  ?Control/stop worrying 0 0 0 0  ?Worry too much - different things 0 0 0 0  ?Trouble relaxing 0 1 1 0  ?Restless 0 0 0 0  ?Easily annoyed or irritable 0 0 1 0  ?Afraid - awful might happen 0 0 0 0  ?Total GAD 7 Score 0 1 2 0  ? ? ? ?Review of Systems:   ?Pertinent items are noted in HPI ?Denies any headaches, blurred vision, fatigue, shortness of breath, chest pain, abdominal pain, abnormal vaginal discharge/itching/odor/irritation, problems with periods, bowel movements, urination, or intercourse unless otherwise stated above. ?Pertinent History Reviewed:   ?Reviewed past medical,surgical, social and family history.  ?Reviewed problem list, medications and allergies. ?Physical Assessment:  ?There were no vitals filed for this visit.There is no height or weight on file to calculate BMI. ?  ?     Physical Examination:  ? General appearance - well appearing, and in no distress ? Mental status - alert, oriented to person, place, and time ? Psych:  She has a normal mood and affect ? Skin - warm and dry, normal color, no suspicious lesions noted ? Chest - effort normal, all lung fields clear to auscultation bilaterally ? Heart - normal rate and regular rhythm ? Neck:  midline trachea, no thyromegaly or nodules ? Breasts - breasts appear normal, no suspicious masses, no skin or nipple changes or  axillary nodes ? Abdomen - soft, nontender, nondistended, no masses or organomegaly ? Pelvic - VULVA: normal appearing vulva with no masses, tenderness or lesions  VAGINA: normal appearing vagina with normal color and discharge, no lesions  CERVIX: normal appearing cervix without discharge or lesions, no CMT ? Thin prep pap is {Desc; done/not:10129} *** HR HPV cotesting ? UTERUS: uterus is felt to be normal size, shape, consistency and nontender  ? ADNEXA: No adnexal masses or tenderness noted. ? Rectal - normal rectal, good sphincter tone, no masses felt. Hemoccult: *** ? Extremities:  No swelling or varicosities noted ? ?Chaperone  present for exam ? ?No results found for this or any previous visit (from the past 24 hour(s)).  ?Assessment & Plan:  ?1) Well-Woman Exam ? ?2) *** ? ?Labs/procedures today: *** ? ?Mammogram: {Mammo f/u:25212::"@ 44yo"}, or sooner if problems ?Colonoscopy: {TCS f/u:25213::"@ 45yo"}, or sooner if problems ? ?No orders of the defined types were placed in this encounter. ? ? ?Meds: No orders of the defined types were placed in this encounter. ? ? ?Follow-up: No follow-ups on file. ? ?Renard Matter, MD ?11/23/2021 ?11:49 AM ? ?

## 2021-11-23 NOTE — Progress Notes (Signed)
? ?GYNECOLOGY OFFICE VISIT NOTE ? ?History:  ?46 y.o. [id number] here today for annual gyn visit and repeat PAP. She denies any abnormal vaginal discharge, bleeding, pelvic pain or other concerns.  ? ?#PAP hx ?2022 - NILM with neg HPV ?9/ 2020 - NILM with HPV +  ?10/2018 - NILM with HPV negative ?08/2017-  NILM with HPV + ? ?#Hemorrhoid ?- uses cream ?- sometimes helpful ?- some spots ?- has not talked PCP about ?- pain and itching ?- not constipation ?- not straining ?- asking about procedures for hemorrhoid removal ? ?The following portions of the patient's history were reviewed and updated as appropriate: allergies, current medications, past family history, past medical history, past social history, past surgical history and problem list.  ? ?Health Maintenance: See PAP hx above.  Normal mammogram on 11/20/2020.  ? ?Review of Systems:  ?Pertinent items noted in HPI ?Review of Systems  ?All other systems reviewed and are negative. ? ?Objective:  ?Physical Exam ?BP (!) 124/111   Pulse (!) 102   Ht 5\' 7"  (1.702 m)   Wt 184 lb 14.4 oz (83.9 kg)   LMP 11/15/2021 (Exact Date)   BMI 28.96 kg/m?  ?Physical Exam ?Vitals and nursing note reviewed. Exam conducted with a chaperone present.  ?HENT:  ?   Head: Normocephalic.  ?Cardiovascular:  ?   Rate and Rhythm: Normal rate.  ?   Pulses: Normal pulses.  ?Pulmonary:  ?   Effort: Pulmonary effort is normal.  ?Chest:  ? ? ?   Comments: Dense breast tissue on right breast (7 o'clock to 8 o'clock area) and left breast (1 o'clock to 3 o'clock area). No nodules or defined lumps. Mobile tissue ?Abdominal:  ?   General: Abdomen is flat.  ?Genitourinary: ?   General: Normal vulva.  ?   Comments: Cervix visualized on speculum exam. PAP collected without any difficulty. No abnormalities noted on speculum exam ?Musculoskeletal:     ?   General: Normal range of motion.  ?   Cervical back: Normal range of motion.  ?Skin: ?   General: Skin is warm.  ?Neurological:  ?   General: No focal  deficit present.  ?   Mental Status: She is alert and oriented to person, place, and time.  ?Psychiatric:     ?   Mood and Affect: Mood normal.  ? ? ?Labs and Imaging ?None ? ?Assessment & Plan:  ?Annual physical ?Overall doing well. Due for mammogram - sent referral.  Discussed other medical conditions as listed below ?STI testing offered and patient selects to have them done. ? ?2. Hx of abnormal PAP ?2022 - NILM with neg HPV ?9/ 2020 - NILM with HPV +  ?10/2018 - NILM with HPV negative ?08/2017-  NILM with HPV + ?Per [facility] app due for repeat co-testing now ( since one year from last PAP). PAP done today. If normal this time [facility] recommends three year follow up ? ?3. Dense breast tissue ?Specifically of right breast ~7 o'clock area and left breast ~3 o'clock area. No nodules or lumps. Due for mammogram.  ?-order placed ? ?4. Hemorrhoids ?Patient with long standing hx of hemorrhoids. Using hemorrhoid cream and witch hazel pads occasionally which helps. Discussed methods to avoid constipation and also using witch hazel pads more frequently for comfort. Patient to discuss further with PCP. We discussed that if creams aren't working as well could do hydrocortisone suppository instead.  ? ?Routine preventative health maintenance measures emphasized. ?Please refer to After Visit  Summary for other counseling recommendations.  ? ?Return in about 1 month (around 12/24/2021), or to discuss contraception options for heavy periods. ? ? ?Total face-to-face time with patient: 25 minutes.  Over 50% of encounter was spent on counseling and coordination of care. ? ?Warner Mccreedy, MD, MPH ?OB Fellow, Faculty Practice ?Center for [employer], [facility] ? ?

## 2021-11-24 LAB — CERVICOVAGINAL ANCILLARY ONLY
Bacterial Vaginitis (gardnerella): POSITIVE — AB
Candida Glabrata: NEGATIVE
Candida Vaginitis: NEGATIVE
Chlamydia: NEGATIVE
Comment: NEGATIVE
Comment: NEGATIVE
Comment: NEGATIVE
Comment: NEGATIVE
Comment: NEGATIVE
Comment: NORMAL
Neisseria Gonorrhea: NEGATIVE
Trichomonas: NEGATIVE

## 2021-11-24 LAB — RPR: RPR Ser Ql: NONREACTIVE

## 2021-11-24 LAB — HIV ANTIBODY (ROUTINE TESTING W REFLEX): HIV Screen 4th Generation wRfx: NONREACTIVE

## 2021-11-26 LAB — CYTOLOGY - PAP
Adequacy: ABSENT
Comment: NEGATIVE
Diagnosis: NEGATIVE
High risk HPV: NEGATIVE

## 2021-12-07 MED ORDER — METRONIDAZOLE 500 MG PO TABS: 500.0000 mg | ORAL_TABLET | Freq: Two times a day (BID) | ORAL | 0 refills | Status: DC

## 2021-12-08 ENCOUNTER — Encounter: Payer: Self-pay | Admitting: Family Medicine

## 2021-12-08 ENCOUNTER — Ambulatory Visit (INDEPENDENT_AMBULATORY_CARE_PROVIDER_SITE_OTHER): Payer: 59 | Admitting: Family Medicine

## 2021-12-08 ENCOUNTER — Other Ambulatory Visit: Payer: Self-pay | Admitting: Family Medicine

## 2021-12-08 VITALS — BP 124/82 | HR 83 | Temp 97.0°F | Wt 185.6 lb

## 2021-12-08 DIAGNOSIS — G8929 Other chronic pain: Secondary | ICD-10-CM | POA: Diagnosis not present

## 2021-12-08 DIAGNOSIS — M25561 Pain in right knee: Secondary | ICD-10-CM

## 2021-12-08 DIAGNOSIS — L918 Other hypertrophic disorders of the skin: Secondary | ICD-10-CM | POA: Diagnosis not present

## 2021-12-08 HISTORY — PX: OTHER SURGICAL HISTORY: SHX169

## 2021-12-08 NOTE — Progress Notes (Signed)
? ?  Subjective:  ? ? Patient ID: Pamela [name], female    DOB: 10-20-1975, 46 y.o.   MRN: [id number] ? ?HPI ?She is here for consult concerning recent blood pressure reading which was quite abnormal.  It showed a very normal pulse pressure so she is here for follow-up on this.  She also has a lesion present on her back that has been bothering her for several years.  She would like it removed as it sometimes does cause some irritation.  She continues have difficulty with her right knee pain.  She does have a previous history of injury with an effusion.  Over the last year she has had continued difficulty with discomfort especially medially.  No popping locking or grinding noted. ? ? ?Review of Systems ? ?   ?Objective:  ? Physical Exam ?Alert and in no distress.  Blood pressure is recorded.  Exam of the back does show pediculated lesion over the lower back area. ?Right knee exam shows no effusion.  Slight tenderness palpation to the medial joint line with McMurray's testing causing discomfort.  Anterior drawer is negative.  Medial and lateral collateral ligaments intact. ? ? ?   ?Assessment & Plan:  ?Skin tag ? ?Chronic pain of right knee - Plan: DG Knee Complete 4 Views Right, Ambulatory referral to Physical Therapy ?I explained that her knee pain clinically sounds like a meniscal damage and the best therapy at this point would be physical therapy.  If she continues have difficulty I will consider getting an x-ray and a steroid injection before referral to orthopedics.  She was comfortable with that. ?I explained that her blood pressure is normal and that the previous blood pressure reading was not accurate. ? ? ?The skin tag was removed without difficulty and hyfrecated with silver nitrate. ? ?

## 2021-12-08 NOTE — Patient Instructions (Signed)
For pain relief always start with 2 Tylenol 4 times per day and if you need to add anything to that you can add 2 Aleve twice per day ?

## 2021-12-23 ENCOUNTER — Encounter: Payer: Self-pay | Admitting: Family Medicine

## 2022-01-13 ENCOUNTER — Ambulatory Visit: Payer: 59 | Admitting: Physical Therapy

## 2022-01-13 ENCOUNTER — Other Ambulatory Visit: Payer: Self-pay

## 2022-01-13 ENCOUNTER — Encounter: Payer: Self-pay | Admitting: Physical Therapy

## 2022-01-13 DIAGNOSIS — G8929 Other chronic pain: Secondary | ICD-10-CM | POA: Diagnosis not present

## 2022-01-13 DIAGNOSIS — M25561 Pain in right knee: Secondary | ICD-10-CM

## 2022-01-13 DIAGNOSIS — R600 Localized edema: Secondary | ICD-10-CM | POA: Diagnosis not present

## 2022-01-13 NOTE — Therapy (Addendum)
OUTPATIENT PHYSICAL THERAPY LOWER EXTREMITY EVALUATION DISCHARGE SUMMARY   Patient Name: Pamela [name] MRN: [id number] DOB:1975/10/13, 46 y.o., female Today's Date: 01/13/2022   PT End of Session - 01/13/22 0838     Visit Number 1    Number of Visits 6    Date for PT Re-Evaluation 02/24/22    Authorization - Number of Visits 30    PT Start Time 0801    PT Stop Time 0838    PT Time Calculation (min) 37 min    Activity Tolerance Patient tolerated treatment well    Behavior During Therapy [facility] for tasks assessed/performed             Past Medical History:  Diagnosis Date   Allergy    RHINITIS   Diabetes mellitus    Gestational diabetes    diet controlled   Recurrent sinus infections    Thyroid disease    THYROID CYST   Vaginal Pap smear, abnormal    Past Surgical History:  Procedure Laterality Date   COLPOSCOPY  09/05/2002   shx1  N/A 12/08/2021   fibroma with numerous lipocytes   Patient Active Problem List   Diagnosis Date Noted   Rash 11/17/2020   Pain in buttock 11/17/2020   Current smoker 11/13/2020   Otalgia 02/06/2020   Chronic congestion of paranasal sinus 02/06/2020   Pain of right thumb 02/06/2020   Thumb swelling 02/06/2020   Pap smear of cervix shows high risk HPV present 06/11/2019   Dysmenorrhea 08/08/2017    PCP: Denita Lung, MD   REFERRING PROVIDER: Denita Lung, MD   REFERRING DIAG: [phone] (ICD-10-CM) - Chronic pain of right knee   THERAPY DIAG:  Chronic pain of right knee - Plan: PT plan of care cert/re-cert  Localized edema - Plan: PT plan of care cert/re-cert  ONSET DATE: chronic x 1 year  SUBJECTIVE:   SUBJECTIVE STATEMENT: Pt is a 46 y/o female who presents to OPPT for chronic knee pain.  She went to [place] about a year ago and was told her "knees were loose."  She fell several years ago (playing basketball with children about 4 years ago) and has had trouble since then.  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  PERTINENT HISTORY: DM  PAIN:  Are you having pain? Yes: NPRS scale: 6 currently, up to 10; at best 4/10 Pain location: Rt knee Pain description: aching Aggravating factors: prolonged flexed knee position Relieving factors: heat, ibuprofen  PRECAUTIONS: None  WEIGHT BEARING RESTRICTIONS No  FALLS:  Has patient fallen in last 6 months? No  LIVING ENVIRONMENT: Lives with: lives with their family (77, 53, 44 y/o boys) Lives in: House/apartment Stairs: No Has following equipment at home: None  OCCUPATION: full time driver for [employer]  PLOF: Independent and Leisure: play with kids, reading; no regular exercise  PATIENT GOALS improve pain, activity, strength   OBJECTIVE:   DIAGNOSTIC FINDINGS: xrays ordered  PATIENT SURVEYS:  01/13/22 FOTO 64 (predicted 70)  COGNITION:  Overall cognitive status: Within functional limits for tasks assessed     SENSATION: WFL  MUSCLE LENGTH:  01/13/22: mild quad tightness noted  POSTURE:  No pertinent abnormalities  EDEMA: Knee at joint line: Rt 14 3/4"  14 9/16"  PALPATION: No significant tenderness noted; mild crepitus noted  LE ROM:  Active ROM Right 01/13/2022 Left 01/13/2022  Knee flexion 136 139  Knee extension 4 (hyperextension) 3 (hyperextension)   (Blank rows = not tested)   LE MMT:  MMT Right 01/13/2022 Left 01/13/2022  Knee flexion 4/5 5/5  Knee extension 4/5 5/5   (Blank rows = not tested)   GAIT: Comments: Indepenent    TODAY'S TREATMENT: 01/13/22:  See HEP - performed trial reps PRN for understanding with min cues needed   PATIENT EDUCATION:  Education details: HEP Person educated: Patient Education method: Explanation, Demonstration, and Handouts Education comprehension: verbalized understanding, returned demonstration, and needs further education   HOME EXERCISE PROGRAM: Access Code: [email or web address] URL:  [email or web address] Date: 01/13/2022 Prepared by: Faustino Congress  Exercises - Seated Knee Extension AROM  - 1 x daily - 7 x weekly - 3 sets - 10 reps - Seated Straight Leg Raise  - 1 x daily - 7 x weekly - 3 sets - 10 reps - Standing Hamstring Curl with Chair Support  - 1 x daily - 7 x weekly - 3 sets - 10 reps - Prone Quadriceps Stretch with Strap  - 1 x daily - 7 x weekly - 1 sets - 3 reps - 30 sec hold  ASSESSMENT:  CLINICAL IMPRESSION: Patient is a 46 y.o. female who was seen today for physical therapy evaluation and treatment for Rt knee pain.  She demonstrates mild strength deficits and mild edema affecting functional mobility. She will benefit from PT to address deficits listed.    OBJECTIVE IMPAIRMENTS decreased mobility, decreased ROM, decreased strength, increased edema, and pain.   ACTIVITY LIMITATIONS community activity, driving, occupation, and laundry.   PERSONAL FACTORS 1 comorbidity: DM  are also affecting patient's functional outcome.    REHAB POTENTIAL: Excellent  CLINICAL DECISION MAKING: Stable/uncomplicated  EVALUATION COMPLEXITY: Low   GOALS: Goals reviewed with patient? Yes  SHORT TERM GOALS: Target date: 02/03/2022  Independent with initial HEP Goal status: INITIAL   LONG TERM GOALS: Target date: 02/24/2022  Independent with final HEP Goal status: INITIAL  2.  FOTO score improved to 75 Goal status: INITIAL  3.  Rt knee strength improved to 5/5 for improved function and mobility Goal status: INIITAL  4.  Report pain < 5/10 with daily activities for improved function Goal status: INITIAL    PLAN: PT FREQUENCY: 1x/week (plan to see biweekly, will see up to 1x/wk if needed)  PT DURATION: 6 weeks  PLANNED INTERVENTIONS: Therapeutic exercises, Therapeutic activity, Neuromuscular re-education, Gait training, Patient/Family education, Joint mobilization, Aquatic Therapy, Dry Needling, Electrical stimulation, Cryotherapy,  Moist heat, Taping, Ultrasound, Ionotophoresis 18m/ml Dexamethasone, and Manual therapy  PLAN FOR NEXT SESSION: review HEP, progress quad/hamstring strength (give gym program if pt joined a gym)     SLaureen Abrahams PT, DPT 01/13/22 8:43 AM    PHYSICAL THERAPY DISCHARGE SUMMARY  Visits from Start of Care: 1  Current functional level related to goals / functional outcomes: SEE ABOVE   Remaining deficits: UNKNOWN   Education / Equipment: HEP   Patient agrees to discharge. Patient goals were not met. Patient is being discharged due to not returning since the last visit.  SLaureen Abrahams PT, DPT 04/05/22 8:39 AM [facility] [address] [address]Phone: [phone]  Fax:  [phone]

## 2022-01-27 ENCOUNTER — Encounter: Payer: 59 | Admitting: Physical Therapy

## 2022-02-01 ENCOUNTER — Ambulatory Visit: Payer: Self-pay | Admitting: Family Medicine

## 2022-02-10 ENCOUNTER — Encounter: Payer: 59 | Admitting: Physical Therapy

## 2022-02-24 ENCOUNTER — Telehealth: Payer: Self-pay | Admitting: Physical Therapy

## 2022-02-24 ENCOUNTER — Encounter: Payer: 59 | Admitting: Physical Therapy

## 2022-02-24 NOTE — Telephone Encounter (Signed)
LVM for pt due to missed PT appt.  Last scheduled appt and pt has not attended PT since initial eval on 01/13/22.  Advised if she needs to reschedule it must be done by 03/14/22 or will need a new referral.    Clarita Crane, PT, DPT 02/24/22 9:02 AM

## 2022-05-11 ENCOUNTER — Encounter: Payer: Self-pay | Admitting: Internal Medicine

## 2022-06-14 ENCOUNTER — Encounter: Payer: Self-pay | Admitting: Internal Medicine

## 2022-06-27 ENCOUNTER — Encounter: Payer: Self-pay | Admitting: Internal Medicine

## 2022-08-31 ENCOUNTER — Telehealth (INDEPENDENT_AMBULATORY_CARE_PROVIDER_SITE_OTHER): Payer: Commercial Managed Care - HMO | Admitting: Family Medicine

## 2022-08-31 ENCOUNTER — Encounter: Payer: Self-pay | Admitting: Family Medicine

## 2022-08-31 DIAGNOSIS — J069 Acute upper respiratory infection, unspecified: Secondary | ICD-10-CM | POA: Diagnosis not present

## 2022-08-31 MED ORDER — AZITHROMYCIN 500 MG PO TABS: 500.0000 mg | ORAL_TABLET | Freq: Every day | ORAL | 0 refills | Status: DC

## 2022-08-31 NOTE — Progress Notes (Signed)
   Subjective:    Patient ID: Pamela [name], female    DOB: Feb 23, 1976, 46 y.o.   MRN: [id number]  HPI Documentation for virtual audio and video telecommunications through [facility] encounter: The patient was located at home. 2 patient identifiers used.  The provider was located in the office. The patient did consent to this visit and is aware of possible charges through their insurance for this visit. The other persons participating in this telemedicine service were none. Time spent on call was 5 minutes and in review of previous records >20 minutes total for counseling and coordination of care. This virtual service is not related to other E/M service within previous 7 days.  She has a 3-week history of a dry cough but no fever, chills, sore throat however 3 days ago cough became productive as well as bilateral earache, sinus pressure.  She has had difficulty with sinus pressure and sinusitis in the past that responds well to a Z-Pak.  Review of Systems     Objective:   Physical Exam Alert and in no distress otherwise not examined       Assessment & Plan:  Viral URI with cough - Plan: azithromycin (ZITHROMAX) 500 MG tablet Recommend she keep yourself well-hydrated and explained that the medicine should work for 7 to 10 days but is still having difficulty at that timeframe, call and I will renew another course of azithromycin.  She was comfortable with that.

## 2023-02-06 ENCOUNTER — Ambulatory Visit (INDEPENDENT_AMBULATORY_CARE_PROVIDER_SITE_OTHER): Payer: BLUE CROSS/BLUE SHIELD | Admitting: General Practice

## 2023-02-06 ENCOUNTER — Encounter: Payer: Self-pay | Admitting: General Practice

## 2023-02-06 ENCOUNTER — Other Ambulatory Visit (HOSPITAL_COMMUNITY)
Admission: RE | Admit: 2023-02-06 | Discharge: 2023-02-06 | Disposition: A | Discharge status: Home / Self Care | Payer: BLUE CROSS/BLUE SHIELD | Source: Ambulatory Visit | Attending: Family Medicine | Admitting: Family Medicine

## 2023-02-06 ENCOUNTER — Other Ambulatory Visit: Payer: Self-pay

## 2023-02-06 VITALS — BP 125/77 | HR 82 | Ht 67.0 in | Wt 184.0 lb

## 2023-02-06 DIAGNOSIS — N898 Other specified noninflammatory disorders of vagina: Secondary | ICD-10-CM

## 2023-02-06 DIAGNOSIS — Z1231 Encounter for screening mammogram for malignant neoplasm of breast: Secondary | ICD-10-CM

## 2023-02-06 DIAGNOSIS — Z113 Encounter for screening for infections with a predominantly sexual mode of transmission: Secondary | ICD-10-CM

## 2023-02-06 DIAGNOSIS — N3941 Urge incontinence: Secondary | ICD-10-CM

## 2023-02-06 NOTE — Progress Notes (Signed)
Patient presents to office today reporting vaginal odor and white cottage cheese discharge x 2 weeks. She reports wearing panty liners more often due to urinary leakage and she wonders if that could be affecting things. Patient requests STD screening while here as well. Patient was instructed in self swab & specimen collected. Advised results will be back in 24-48 hours and available via mychart. Discussed referral to pelvic PT for urinary leakage- order placed. Patient would also like mammogram set up. Orders placed- told patient I would send her a mychart message later with the appt information. Patient verbalized understanding.   Chase Caller RN BSN 02/06/23

## 2023-02-07 ENCOUNTER — Other Ambulatory Visit: Payer: Self-pay | Admitting: Family Medicine

## 2023-02-07 DIAGNOSIS — B9689 Other specified bacterial agents as the cause of diseases classified elsewhere: Secondary | ICD-10-CM

## 2023-02-07 LAB — RPR: RPR Ser Ql: NONREACTIVE

## 2023-02-07 LAB — CERVICOVAGINAL ANCILLARY ONLY
Bacterial Vaginitis (gardnerella): POSITIVE — AB
Candida Glabrata: NEGATIVE
Candida Vaginitis: NEGATIVE
Chlamydia: NEGATIVE
Comment: NEGATIVE
Comment: NEGATIVE
Comment: NEGATIVE
Comment: NEGATIVE
Comment: NEGATIVE
Comment: NORMAL
Neisseria Gonorrhea: NEGATIVE
Trichomonas: NEGATIVE

## 2023-02-07 LAB — HIV ANTIBODY (ROUTINE TESTING W REFLEX): HIV Screen 4th Generation wRfx: NONREACTIVE

## 2023-02-07 LAB — HEPATITIS B SURFACE ANTIGEN: Hepatitis B Surface Ag: NEGATIVE

## 2023-02-07 LAB — HEPATITIS C ANTIBODY: Hep C Virus Ab: NONREACTIVE

## 2023-02-07 MED ORDER — METRONIDAZOLE 500 MG PO TABS: 500.0000 mg | ORAL_TABLET | Freq: Two times a day (BID) | ORAL | 0 refills | Status: DC

## 2023-02-08 ENCOUNTER — Telehealth: Payer: Self-pay

## 2023-02-08 ENCOUNTER — Encounter: Payer: Self-pay | Admitting: General Practice

## 2023-02-08 NOTE — Telephone Encounter (Addendum)
-----   Message from Federico Flake, MD sent at 02/07/2023  4:35 PM EDT ----- BV present. I sent in medications. Please call patient  Left message stating to return our call for results.   Leonette Nutting  02/08/23

## 2023-02-09 NOTE — Telephone Encounter (Signed)
Left message that this is our second attempt in trying to reach you.  An Rx has been sent to your [place] pharmacy on [address] A letter has also been sent to your home.  If you have questions please contact the office. Letter sent.   Leonette Nutting  02/09/23

## 2023-02-15 ENCOUNTER — Encounter: Payer: Self-pay | Admitting: *Deleted

## 2023-02-21 ENCOUNTER — Ambulatory Visit: Payer: BLUE CROSS/BLUE SHIELD | Admitting: Obstetrics and Gynecology

## 2023-02-21 ENCOUNTER — Ambulatory Visit: Admission: RE | Admit: 2023-02-21 | Payer: BLUE CROSS/BLUE SHIELD | Source: Ambulatory Visit

## 2023-04-08 ENCOUNTER — Other Ambulatory Visit: Payer: Self-pay

## 2023-04-08 ENCOUNTER — Emergency Department (HOSPITAL_BASED_OUTPATIENT_CLINIC_OR_DEPARTMENT_OTHER): Payer: BLUE CROSS/BLUE SHIELD

## 2023-04-08 ENCOUNTER — Encounter (HOSPITAL_BASED_OUTPATIENT_CLINIC_OR_DEPARTMENT_OTHER): Payer: Self-pay

## 2023-04-08 ENCOUNTER — Emergency Department (HOSPITAL_BASED_OUTPATIENT_CLINIC_OR_DEPARTMENT_OTHER)
Admission: EM | Admit: 2023-04-08 | Discharge: 2023-04-08 | Disposition: A | Discharge status: Home / Self Care | Payer: BLUE CROSS/BLUE SHIELD | Attending: Emergency Medicine | Admitting: Emergency Medicine

## 2023-04-08 DIAGNOSIS — R531 Weakness: Secondary | ICD-10-CM | POA: Diagnosis present

## 2023-04-08 DIAGNOSIS — M62838 Other muscle spasm: Secondary | ICD-10-CM | POA: Insufficient documentation

## 2023-04-08 LAB — CBC WITH DIFFERENTIAL/PLATELET
Abs Immature Granulocytes: 0.01 10*3/uL (ref 0.00–0.07)
Basophils Absolute: 0 10*3/uL (ref 0.0–0.1)
Basophils Relative: 0 %
Eosinophils Absolute: 0 10*3/uL (ref 0.0–0.5)
Eosinophils Relative: 1 %
HCT: 38 % (ref 36.0–46.0)
Hemoglobin: 12.6 g/dL (ref 12.0–15.0)
Immature Granulocytes: 0 %
Lymphocytes Relative: 41 %
Lymphs Abs: 2.5 10*3/uL (ref 0.7–4.0)
MCH: 28.5 pg (ref 26.0–34.0)
MCHC: 33.2 g/dL (ref 30.0–36.0)
MCV: 86 fL (ref 80.0–100.0)
Monocytes Absolute: 0.5 10*3/uL (ref 0.1–1.0)
Monocytes Relative: 9 %
Neutro Abs: 2.9 10*3/uL (ref 1.7–7.7)
Neutrophils Relative %: 49 %
Platelets: 420 10*3/uL — ABNORMAL HIGH (ref 150–400)
RBC: 4.42 MIL/uL (ref 3.87–5.11)
RDW: 14.1 % (ref 11.5–15.5)
WBC: 6 10*3/uL (ref 4.0–10.5)
nRBC: 0 % (ref 0.0–0.2)

## 2023-04-08 LAB — COMPREHENSIVE METABOLIC PANEL
ALT: 17 U/L (ref 0–44)
AST: 17 U/L (ref 15–41)
Albumin: 3.8 g/dL (ref 3.5–5.0)
Alkaline Phosphatase: 69 U/L (ref 38–126)
Anion gap: 11 (ref 5–15)
BUN: 9 mg/dL (ref 6–20)
CO2: 23 mmol/L (ref 22–32)
Calcium: 8.6 mg/dL — ABNORMAL LOW (ref 8.9–10.3)
Chloride: 103 mmol/L (ref 98–111)
Creatinine, Ser: 0.81 mg/dL (ref 0.44–1.00)
GFR, Estimated: >60 mL/min (ref >60)
Glucose, Bld: 131 mg/dL — ABNORMAL HIGH (ref 70–99)
Potassium: 3.5 mmol/L (ref 3.5–5.1)
Sodium: 137 mmol/L (ref 135–145)
Total Bilirubin: 0.3 mg/dL (ref 0.3–1.2)
Total Protein: 7 g/dL (ref 6.5–8.1)

## 2023-04-08 LAB — URINALYSIS, ROUTINE W REFLEX MICROSCOPIC
Bilirubin Urine: NEGATIVE
Glucose, UA: NEGATIVE mg/dL
Ketones, ur: NEGATIVE mg/dL
Leukocytes,Ua: NEGATIVE
Nitrite: NEGATIVE
Protein, ur: NEGATIVE mg/dL
Specific Gravity, Urine: >=1.03 (ref 1.005–1.030)
pH: 6.5 (ref 5.0–8.0)

## 2023-04-08 LAB — LIPASE, BLOOD: Lipase: 54 U/L — ABNORMAL HIGH (ref 11–51)

## 2023-04-08 LAB — URINALYSIS, MICROSCOPIC (REFLEX)

## 2023-04-08 LAB — TROPONIN I (HIGH SENSITIVITY): Troponin I (High Sensitivity): <2 ng/L (ref <18)

## 2023-04-08 MED ORDER — CYCLOBENZAPRINE HCL 10 MG PO TABS: 10.0000 mg | ORAL_TABLET | Freq: Two times a day (BID) | ORAL | 0 refills | Status: DC | PRN

## 2023-04-08 MED ORDER — SODIUM CHLORIDE 0.9 % IV BOLUS
1000.0000 mL | Freq: Once | INTRAVENOUS | Status: AC
  Administered 2023-04-08: 1000 mL via INTRAVENOUS

## 2023-04-08 MED ORDER — ONDANSETRON HCL 4 MG/2ML IJ SOLN
4.0000 mg | Freq: Once | INTRAMUSCULAR | Status: AC
  Administered 2023-04-08: 4 mg via INTRAVENOUS
  Filled 2023-04-08: qty 2

## 2023-04-08 NOTE — Discharge Instructions (Signed)
I have prescribed you Flexeril for muscle spasms.  This medication is sedating so do not use while driving or doing dangerous activities.  Do not mix alcohol or drugs.  Follow-up with your primary care doctor.  Workup today is unremarkable.  Return if symptoms worsen.

## 2023-04-08 NOTE — ED Provider Notes (Signed)
[place] [facility] Provider Note   CSN: [id number] Arrival date & time: 04/08/23  1805     History  Chief Complaint  Patient presents with   Multiple Complaints    Pamela [name] is a 47 y.o. female.  Patient here feeling generally unwell.  Having some back pain, nausea, some nerves.  She denies any specific chest pain or shortness of breath or weakness or numbness.  She feels little bit dehydrated.  She feels like she is having some muscle spasms in her low back.  Denies any pain with urination.  Does not have any medical problems.  Does not take any medications.  No recent surgery or travel.  Denies any fevers or chills or cough or sputum production.  The history is provided by the patient.       Home Medications Prior to Admission medications   Medication Sig Start Date End Date Taking? Authorizing Provider  cyclobenzaprine (FLEXERIL) 10 MG tablet Take 1 tablet (10 mg total) by mouth 2 (two) times daily as needed for muscle spasms. 04/08/23  Yes Fitzgerald Dunne, DO  metroNIDAZOLE (FLAGYL) 500 MG tablet Take 1 tablet (500 mg total) by mouth 2 (two) times daily. 02/07/23   Federico Flake, MD      Allergies    Cephalexin    Review of Systems   Review of Systems  Physical Exam Updated Vital Signs BP (!) 143/94 (BP Location: Left Arm)   Pulse 82   Temp 98.4 F (36.9 C) (Oral)   Resp 16   Ht 5\' 7"  (1.702 m)   Wt 85.7 kg   SpO2 99%   BMI 29.60 kg/m  Physical Exam Vitals and nursing note reviewed.  Constitutional:      General: She is not in acute distress.    Appearance: She is well-developed. She is not ill-appearing.  HENT:     Head: Normocephalic and atraumatic.     Nose: Nose normal.     Mouth/Throat:     Mouth: Mucous membranes are moist.  Eyes:     Extraocular Movements: Extraocular movements intact.     Conjunctiva/sclera: Conjunctivae normal.     Pupils: Pupils are equal, round, and reactive to light.   Cardiovascular:     Rate and Rhythm: Normal rate and regular rhythm.     Pulses: Normal pulses.     Heart sounds: Normal heart sounds. No murmur heard. Pulmonary:     Effort: Pulmonary effort is normal. No respiratory distress.     Breath sounds: Normal breath sounds.  Abdominal:     Palpations: Abdomen is soft.     Tenderness: There is no abdominal tenderness.  Musculoskeletal:        General: No swelling.     Cervical back: Normal range of motion and neck supple.  Skin:    General: Skin is warm and dry.     Capillary Refill: Capillary refill takes less than 2 seconds.  Neurological:     General: No focal deficit present.     Mental Status: She is alert and oriented to person, place, and time.     Cranial Nerves: No cranial nerve deficit.     Sensory: No sensory deficit.     Motor: No weakness.  Psychiatric:        Mood and Affect: Mood normal.     ED Results / Procedures / Treatments   Labs (all labs ordered are listed, but only abnormal results are displayed) Labs  Reviewed  CBC WITH DIFFERENTIAL/PLATELET - Abnormal; Notable for the following components:      Result Value   Platelets 420 (*)    All other components within normal limits  COMPREHENSIVE METABOLIC PANEL - Abnormal; Notable for the following components:   Glucose, Bld 131 (*)    Calcium 8.6 (*)    All other components within normal limits  LIPASE, BLOOD - Abnormal; Notable for the following components:   Lipase 54 (*)    All other components within normal limits  URINALYSIS, ROUTINE W REFLEX MICROSCOPIC - Abnormal; Notable for the following components:   Hgb urine dipstick LARGE (*)    All other components within normal limits  URINALYSIS, MICROSCOPIC (REFLEX) - Abnormal; Notable for the following components:   Bacteria, UA FEW (*)    All other components within normal limits  TROPONIN I (HIGH SENSITIVITY)    EKG EKG Interpretation Date/Time:  Saturday April 08 2023 18:17:00 EDT Ventricular Rate:   86 PR Interval:  137 QRS Duration:  78 QT Interval:  349 QTC Calculation: 418 R Axis:   67  Text Interpretation: Sinus rhythm Confirmed by Virgina Norfolk [phone]) on 04/08/2023 6:27:06 PM  Radiology DG Chest Portable 1 View  Result Date: 04/08/2023 CLINICAL DATA:  Shortness of breath, chest pain EXAM: PORTABLE CHEST 1 VIEW COMPARISON:  04/15/2014 FINDINGS: Cardiac size is within normal limits. There are linear densities in right lower lung field suggesting scarring or subsegmental atelectasis. There is no pleural effusion or pneumothorax. IMPRESSION: There are small linear densities in right lower lung field suggesting scarring or subsegmental atelectasis. There are no signs of pulmonary edema or focal pulmonary consolidation. Electronically Signed   By: Ernie Avena M.D.   On: 04/08/2023 18:51    Procedures Procedures    Medications Ordered in ED Medications  sodium chloride 0.9 % bolus 1,000 mL (1,000 mLs Intravenous New Bag/Given 04/08/23 1848)  ondansetron (ZOFRAN) injection 4 mg (4 mg Intravenous Given 04/08/23 1849)    ED Course/ Medical Decision Making/ A&P                                 Medical Decision Making Amount and/or Complexity of Data Reviewed Labs: ordered. Radiology: ordered.  Risk Prescription drug management.   Vici A Bonilla is here with more generalized weakness.  Unremarkable vitals.  No fever.  No significant medical history.  Overall nonspecific symptoms.  Sounds like she is having back spasms in the low back.  But she is having some general fatigue.  Will do workup to evaluate for infectious process, cardiac process will get a CBC, CMP, lipase, urinalysis, troponin, EKG, chest x-ray.  Overall she appears well.  Neurologically she is intact.  Have no concern for stroke or intracranial process.  Differential diagnosis likely stress/back spasm seems less likely to be infectious process or cardiac process such as ACS.  She is PERC negative and doubt  PE.  EKG shows sinus rhythm.  No ischemic changes.  Will give IV fluids, IV Zofran.  I reviewed interpreted EKG.  Per my review interpretation labs no significant anemia or electrolyte abnormality kidney injury or leukocytosis.  Troponin unremarkable.  Chest x-ray shows no evidence of pneumonia or pneumothorax.  Overall workup is unremarkable.  Will treat her for muscle spasms with Flexeril.  Will have her follow-up with primary care doctor.  Return precautions given.  Discharged in good condition.  This chart was dictated using  voice recognition software.  Despite best efforts to proofread,  errors can occur which can change the documentation meaning.         Final Clinical Impression(s) / ED Diagnoses Final diagnoses:  Weakness  Muscle spasm    Rx / DC Orders ED Discharge Orders          Ordered    cyclobenzaprine (FLEXERIL) 10 MG tablet  2 times daily PRN        04/08/23 1843              Virgina Norfolk, DO 04/08/23 1927

## 2023-04-08 NOTE — ED Triage Notes (Signed)
Pt reports fatigue, nausea, headache, SHOB on inspiration, back pain, "shakiness" x3 days.

## 2023-10-14 ENCOUNTER — Emergency Department (HOSPITAL_BASED_OUTPATIENT_CLINIC_OR_DEPARTMENT_OTHER)
Admission: EM | Admit: 2023-10-14 | Discharge: 2023-10-14 | Disposition: A | Discharge status: Home / Self Care | Payer: Self-pay | Attending: Emergency Medicine | Admitting: Emergency Medicine

## 2023-10-14 ENCOUNTER — Emergency Department (HOSPITAL_BASED_OUTPATIENT_CLINIC_OR_DEPARTMENT_OTHER): Payer: Self-pay

## 2023-10-14 ENCOUNTER — Other Ambulatory Visit: Payer: Self-pay

## 2023-10-14 ENCOUNTER — Encounter (HOSPITAL_BASED_OUTPATIENT_CLINIC_OR_DEPARTMENT_OTHER): Payer: Self-pay | Admitting: Emergency Medicine

## 2023-10-14 DIAGNOSIS — M25521 Pain in right elbow: Secondary | ICD-10-CM | POA: Insufficient documentation

## 2023-10-14 DIAGNOSIS — W19XXXA Unspecified fall, initial encounter: Secondary | ICD-10-CM | POA: Insufficient documentation

## 2023-10-14 DIAGNOSIS — S0990XA Unspecified injury of head, initial encounter: Secondary | ICD-10-CM | POA: Insufficient documentation

## 2023-10-14 DIAGNOSIS — M542 Cervicalgia: Secondary | ICD-10-CM | POA: Insufficient documentation

## 2023-10-14 MED ORDER — METHOCARBAMOL 500 MG PO TABS: 500.0000 mg | ORAL_TABLET | Freq: Two times a day (BID) | ORAL | 0 refills | Status: DC | PRN

## 2023-10-14 MED ORDER — LIDOCAINE 5 % EX PTCH
1.0000 | MEDICATED_PATCH | CUTANEOUS | Status: DC
  Administered 2023-10-14: 1 via TRANSDERMAL
  Filled 2023-10-14: qty 1

## 2023-10-14 NOTE — ED Triage Notes (Signed)
 Pt c/o RT elbow pain since fall 1 mo ago

## 2023-10-14 NOTE — ED Provider Notes (Signed)
 [place] [facility] Provider Note   CSN: [id number] Arrival date & time: 10/14/23  1451     History Chief Complaint  Patient presents with   Elbow Pain    Pamela [name] is a 48 y.o. female reportedly otherwise healthy presents emerged from today for evaluation of right elbow pain.  She is also complaining about some head pain and neck pain.  She reports that she possibly had a syncopal event over a month ago.  She reports that she was getting out of the shower and bent down to pick something up and stood up felt lightheaded and that was lasting treatment burst.  She reports she was having some head and neck pain.  Was seen at urgent care.  Did not receive any imaging.  She reports that she still been having some pain into her head and neck.  Is relieved with ibuprofen  but still returns.  She is also been having some pain into her right elbow.  She denies any radiation into her arm or hand.  Denies any trouble walking or talking.  Denies any visual changes.  Denies any fevers.  Has not followed up with her primary care doctor about this.  HPI     Home Medications Prior to Admission medications   Medication Sig Start Date End Date Taking? Authorizing Provider  cyclobenzaprine  (FLEXERIL ) 10 MG tablet Take 1 tablet (10 mg total) by mouth 2 (two) times daily as needed for muscle spasms. 04/08/23   Curatolo, Adam, DO  metroNIDAZOLE  (FLAGYL ) 500 MG tablet Take 1 tablet (500 mg total) by mouth 2 (two) times daily. 02/07/23   Eldonna Suzen Octave, MD      Allergies    Cephalexin    Review of Systems   Review of Systems  Constitutional:  Negative for chills and fever.  Eyes:  Negative for photophobia and visual disturbance.  Musculoskeletal:  Positive for arthralgias and neck pain. Negative for gait problem and neck stiffness.  Neurological:  Positive for headaches. Negative for speech difficulty and weakness.    Physical Exam Updated Vital Signs BP  (!) 143/85 (BP Location: Right Arm)   Pulse 89   Temp 98.7 F (37.1 C) (Oral)   Resp 18   Ht 5' 7 (1.702 m)   Wt 87.5 kg   SpO2 99%   BMI 30.23 kg/m  Physical Exam Vitals and nursing note reviewed.  Constitutional:      General: She is not in acute distress.    Appearance: She is not toxic-appearing.  HENT:     Head: Normocephalic.     Comments: No battle signs or raccoon eyes    Mouth/Throat:     Mouth: Mucous membranes are moist.  Eyes:     General: No scleral icterus.    Extraocular Movements: Extraocular movements intact.     Pupils: Pupils are equal, round, and reactive to light.  Neck:     Comments: Some diffuse posterior neck tenderness, mainly to the paraspinal area but does have some midline tenderness.  No step-offs or deformities.  Full range of motion however does have some pain.  No nuchal rigidity. Cardiovascular:     Rate and Rhythm: Normal rate.  Pulmonary:     Effort: Pulmonary effort is normal. No respiratory distress.  Musculoskeletal:        General: Tenderness present.     Cervical back: Normal range of motion. Tenderness present.     Comments: Does have some tenderness to  the lateral condyle.  Compartments are soft.  Palpable radial pulses.  Equal grip strength.  Strength intact and symmetric in the bilateral upper and lower extremities.  Sensation fully intact and symmetric.  Brisk refill present in all 5 fingers.    Skin:    General: Skin is warm and dry.  Neurological:     Mental Status: She is alert.     GCS: GCS eye subscore is 4. GCS verbal subscore is 5. GCS motor subscore is 6.     Cranial Nerves: No cranial nerve deficit, dysarthria or facial asymmetry.     Sensory: No sensory deficit.     Motor: No weakness.     Gait: Gait normal.     ED Results / Procedures / Treatments   Labs (all labs ordered are listed, but only abnormal results are displayed) Labs Reviewed - No data to display  EKG None  Radiology CT Head Wo Contrast Result  Date: 10/14/2023 CLINICAL DATA:  Fall 1 month ago, continued head and neck pain EXAM: CT HEAD WITHOUT CONTRAST CT CERVICAL SPINE WITHOUT CONTRAST TECHNIQUE: Multidetector CT imaging of the head and cervical spine was performed following the standard protocol without intravenous contrast. Multiplanar CT image reconstructions of the cervical spine were also generated. RADIATION DOSE REDUCTION: This exam was performed according to the departmental dose-optimization program which includes automated exposure control, adjustment of the mA and/or kV according to patient size and/or use of iterative reconstruction technique. COMPARISON:  None Available. FINDINGS: CT HEAD FINDINGS Brain: No evidence of acute infarction, hemorrhage, hydrocephalus, extra-axial collection or mass lesion/mass effect. Vascular: No hyperdense vessel or unexpected calcification. Skull: Normal. Negative for fracture or focal lesion. Sinuses/Orbits: No acute finding. Other: None. CT CERVICAL SPINE FINDINGS Alignment: Straightening of the normal cervical lordosis. Skull base and vertebrae: No acute fracture. No primary bone lesion or focal pathologic process. Soft tissues and spinal canal: No prevertebral fluid or swelling. No visible canal hematoma. Disc levels: Focally mild disc degenerative changes C5-C6 with otherwise intact disc spaces. Upper chest: Negative. Other: None. IMPRESSION: 1. No acute intracranial pathology. 2. No fracture or static subluxation of the cervical spine. Electronically Signed   By: Marolyn JONETTA Jaksch M.D.   On: 10/14/2023 18:01   CT Cervical Spine Wo Contrast Result Date: 10/14/2023 CLINICAL DATA:  Fall 1 month ago, continued head and neck pain EXAM: CT HEAD WITHOUT CONTRAST CT CERVICAL SPINE WITHOUT CONTRAST TECHNIQUE: Multidetector CT imaging of the head and cervical spine was performed following the standard protocol without intravenous contrast. Multiplanar CT image reconstructions of the cervical spine were also  generated. RADIATION DOSE REDUCTION: This exam was performed according to the departmental dose-optimization program which includes automated exposure control, adjustment of the mA and/or kV according to patient size and/or use of iterative reconstruction technique. COMPARISON:  None Available. FINDINGS: CT HEAD FINDINGS Brain: No evidence of acute infarction, hemorrhage, hydrocephalus, extra-axial collection or mass lesion/mass effect. Vascular: No hyperdense vessel or unexpected calcification. Skull: Normal. Negative for fracture or focal lesion. Sinuses/Orbits: No acute finding. Other: None. CT CERVICAL SPINE FINDINGS Alignment: Straightening of the normal cervical lordosis. Skull base and vertebrae: No acute fracture. No primary bone lesion or focal pathologic process. Soft tissues and spinal canal: No prevertebral fluid or swelling. No visible canal hematoma. Disc levels: Focally mild disc degenerative changes C5-C6 with otherwise intact disc spaces. Upper chest: Negative. Other: None. IMPRESSION: 1. No acute intracranial pathology. 2. No fracture or static subluxation of the cervical spine. Electronically Signed  By: Marolyn JONETTA Jaksch M.D.   On: 10/14/2023 18:01   DG Elbow Complete Right Result Date: 10/14/2023 CLINICAL DATA:  Right elbow pain, fell 1 month ago EXAM: RIGHT ELBOW - COMPLETE 3+ VIEW COMPARISON:  None Available. FINDINGS: Frontal, bilateral oblique, and lateral views of the right elbow are obtained. No fracture, subluxation, or dislocation. Joint spaces are well preserved. No joint effusion. Soft tissues are unremarkable. IMPRESSION: 1. Unremarkable right elbow. Electronically Signed   By: Ozell Daring M.D.   On: 10/14/2023 15:49    Procedures Procedures   Medications Ordered in ED Medications - No data to display  ED Course/ Medical Decision Making/ A&P    Medical Decision Making Amount and/or Complexity of Data Reviewed Radiology: ordered.  Risk Prescription drug  management.   48 y.o. female presents to the ER for evaluation of elbow and head pain after fall over a month ago. Differential diagnosis includes but is not limited to trauma, MSK. Vital signs mildly elevated blood pressure otherwise unremarkable. Physical exam as noted above.   Patient did not mention hitting her head in triage however mention this to me.  She is requesting CT scans of her head and neck.  Given that this has been from over a month ago and she does not have any focal deficits, step-offs or deformities, or signs of trauma I do not think this is truly warranted.  Patient reports that she is concerned for a skull fracture and was supposed to follow-up with emergency room and however did not.  Will order per patient's request.  XR elbow 1. Unremarkable right elbow.   CT Head and Cervical spine shows  1. No acute intracranial pathology. 2. No fracture or static subluxation of the cervical spine.   CT of the head and neck is unremarkable.  X-ray of the elbow is unremarkable as well.  Think patient likely has some lateral epicondylitis.  She feels better with the lidocaine  patch.  Recommend ibuprofen  Tylenol  for pain.  For her head and neck pain, this is likely more musculoskeletal given its mainly in the paraspinal aspect.  Recommended follow with her primary care doctor about this.  Will send her home with some muscle relaxers.  I do not think she has any meningitis given she has no fever or nuchal rigidity.  She is neuro vastly intact in the distal right arm.  No step-offs or deformities.  Full range of motion with some pain.  She stable for discharge home with close outpatient follow-up.  We discussed the results of the labs/imaging. The plan is follow-up with specialties, take medications as prescribed. We discussed strict return precautions and red flag symptoms. The patient verbalized their understanding and agrees to the plan. The patient is stable and being discharged home in good  condition.  Portions of this report may have been transcribed using voice recognition software. Every effort was made to ensure accuracy; however, inadvertent computerized transcription errors may be present.   Final Clinical Impression(s) / ED Diagnoses Final diagnoses:  Right elbow pain  Injury of head, initial encounter    Rx / DC Orders ED Discharge Orders          Ordered    methocarbamol  (ROBAXIN ) 500 MG tablet  2 times daily PRN        10/14/23 1830              Bernis Ernst, DEVONNA 10/14/23 2029    Nicholaus Cassondra DEL, MD 10/15/23 1459

## 2023-10-14 NOTE — Discharge Instructions (Addendum)
 You were seen in the ER for evaluation for your elbow pain and head injury that happened a month ago.  Your imaging findings were unremarkable.  I think you likely had irritation of the nerve or ligaments in this.  I have given a follow-up for a hand surgeon who also works on the elbows as well.  Your CT scans of your head were unremarkable.  You have be having some muscular pain.  Please make sure you follow your primary care doctor about this.  I will prescribe you some muscle laxer's to take as needed.  Please do not drive or operate heavy machinery while on this medication as it will make you sleepy.  For pain, I recommended 1000mg  of Tylenol  and/or 600 mg of ibuprofen  every 6 hours as needed for pain. You can also try topical lidocaine  patches. If you have any concerns, new or worsening symptoms, please return to your nearest emergency department for evaluation.

## 2023-10-31 ENCOUNTER — Encounter: Payer: Self-pay | Admitting: Internal Medicine

## 2024-01-16 ENCOUNTER — Ambulatory Visit (INDEPENDENT_AMBULATORY_CARE_PROVIDER_SITE_OTHER): Payer: Self-pay | Admitting: Family Medicine

## 2024-01-16 ENCOUNTER — Encounter: Payer: Self-pay | Admitting: Family Medicine

## 2024-01-16 VITALS — BP 124/84 | HR 72 | Wt 197.0 lb

## 2024-01-16 DIAGNOSIS — H9202 Otalgia, left ear: Secondary | ICD-10-CM | POA: Diagnosis not present

## 2024-01-16 NOTE — Progress Notes (Signed)
   Subjective:    Patient ID: Pamela [name], female    DOB: 07/16/1976, 48 y.o.   MRN: [id number]  HPI She complains of intermittent left ear pain for about a year.  Nothing in particular seems to make this worse.  She also has concern over possible swelling on the right jaw area.  No sore throat cough or congestion.   Review of Systems     Objective:    Physical Exam Alert and in no distress.  Both tympanic membranes and canals are totally normal.  Neck is supple without adenopathy or thyromegaly.  The right side mandibular area might possibly be a little bit more full in the area of the salivary gland.       Assessment & Plan:  Left ear pain I explained that something that is intermittent for over a year speaks against anything of major concern.  Cautioned against putting anything in the canal to clean out the wax.  Explained that I felt nothing in the mandibular area that has any concern for any major illnesses.  She was comfortable with that.

## 2024-04-25 ENCOUNTER — Encounter: Payer: Self-pay | Admitting: Internal Medicine

## 2024-04-25 ENCOUNTER — Ambulatory Visit: Admitting: Internal Medicine

## 2024-04-25 VITALS — BP 120/80 | HR 90 | Temp 98.3°F | Ht 67.0 in | Wt 198.3 lb

## 2024-04-25 DIAGNOSIS — Z23 Encounter for immunization: Secondary | ICD-10-CM | POA: Diagnosis not present

## 2024-04-25 DIAGNOSIS — M25521 Pain in right elbow: Secondary | ICD-10-CM

## 2024-04-25 DIAGNOSIS — Z1211 Encounter for screening for malignant neoplasm of colon: Secondary | ICD-10-CM

## 2024-04-25 DIAGNOSIS — J302 Other seasonal allergic rhinitis: Secondary | ICD-10-CM

## 2024-04-25 DIAGNOSIS — Z8632 Personal history of gestational diabetes: Secondary | ICD-10-CM

## 2024-04-25 DIAGNOSIS — Z1231 Encounter for screening mammogram for malignant neoplasm of breast: Secondary | ICD-10-CM

## 2024-04-25 MED ORDER — FLUTICASONE PROPIONATE 50 MCG/ACT NA SUSP
2.0000 | Freq: Every day | NASAL | 6 refills | Status: AC
Start: 2024-04-25 — End: ?

## 2024-04-25 MED ORDER — CETIRIZINE HCL 10 MG PO TABS
10.0000 mg | ORAL_TABLET | Freq: Every day | ORAL | 11 refills | Status: AC
Start: 2024-04-25 — End: ?

## 2024-04-25 NOTE — Progress Notes (Signed)
 New Patient Office Visit     CC/Reason for Visit: Establish care, discuss chronic and acute concerns Previous PCP: Norleen Jobs, MD Last Visit: May 2025  HPI: Pamela [name] is a 48 y.o. female who is coming in today for the above mentioned reasons. Past Medical History is significant for: Ongoing nicotine dependence, gestational diabetes with all 3 pregnancies, youngest son is age 48.  She has not had routine medical care other than for acute issues in the last few years.  She has been having significant issues with seasonal allergies, ear congestion and postnasal drip.  In December 2024 she suffered a fall during which she injured her right elbow and it is still quite painful for her she is requesting an orthopedic referral.   Past Medical/Surgical History: Past Medical History:  Diagnosis Date   Allergy    RHINITIS   Diabetes mellitus    Gestational diabetes    diet controlled   Recurrent sinus infections    Thyroid disease    THYROID CYST   Vaginal Pap smear, abnormal     Past Surgical History:  Procedure Laterality Date   COLPOSCOPY  09/05/2002   shx1  N/A 12/08/2021   fibroma with numerous lipocytes    Social History:  reports that she has been smoking cigars. She has never used smokeless tobacco. She reports current alcohol  use. She reports that she does not use drugs.  Allergies: Allergies  Allergen Reactions   Cephalexin Hives    Pt says she can take PCN without any problems    Family History:  Family History  Problem Relation Age of Onset   Diabetes Father    Heart disease Father    Hypertension Father    Prostate cancer Father    Stroke Father    Asthma Sister    Hypertension Sister    Arthritis Paternal Grandmother    Hypertension Paternal Grandmother    Diabetes Paternal Grandmother    Stroke Paternal Grandmother    Arthritis Paternal Grandfather    Hypertension Paternal Grandfather    Multiple sclerosis Mother      Current Outpatient  Medications:    cetirizine  (ZYRTEC ) 10 MG tablet, Take 1 tablet (10 mg total) by mouth daily., Disp: 30 tablet, Rfl: 11   fluticasone  (FLONASE ) 50 MCG/ACT nasal spray, Place 2 sprays into both nostrils daily., Disp: 16 g, Rfl: 6   methocarbamol  (ROBAXIN ) 500 MG tablet, Take 1 tablet (500 mg total) by mouth 2 (two) times daily as needed. (Patient not taking: Reported on 01/16/2024), Disp: 10 tablet, Rfl: 0   metroNIDAZOLE  (FLAGYL ) 500 MG tablet, Take 1 tablet (500 mg total) by mouth 2 (two) times daily. (Patient not taking: Reported on 01/16/2024), Disp: 14 tablet, Rfl: 0  Review of Systems:  Negative except as indicated in HPI.   Physical Exam: Vitals:   04/25/24 1535  BP: 120/80  Pulse: 90  Temp: 98.3 F (36.8 C)  TempSrc: Oral  SpO2: 100%  Weight: 198 lb 4.8 oz (89.9 kg)  Height: 5' 7 (1.702 m)   Body mass index is 31.06 kg/m.  Physical Exam Vitals reviewed.  Constitutional:      Appearance: Normal appearance. She is obese.  HENT:     Head: Normocephalic and atraumatic.     Right Ear: Hearing and tympanic membrane normal. No middle ear effusion. There is no impacted cerumen.     Left Ear: Hearing and tympanic membrane normal.  No middle ear effusion. There is no impacted cerumen.  Eyes:     Conjunctiva/sclera: Conjunctivae normal.  Cardiovascular:     Rate and Rhythm: Normal rate and regular rhythm.  Pulmonary:     Effort: Pulmonary effort is normal.     Breath sounds: Normal breath sounds.  Skin:    General: Skin is warm and dry.  Neurological:     General: No focal deficit present.     Mental Status: She is alert and oriented to person, place, and time.  Psychiatric:        Mood and Affect: Mood normal.        Behavior: Behavior normal.        Thought Content: Thought content normal.        Judgment: Judgment normal.     Flowsheet Row Office Visit from 04/25/2024 in [facility] [place]  PHQ-9 Total Score 2     Impression and  Plan:  Screening for colon cancer -     Ambulatory referral to Gastroenterology  Encounter for screening mammogram for malignant neoplasm of breast -     Digital Screening Mammogram, Left and Right; Future  Right elbow pain -     Ambulatory referral to Orthopedics  Seasonal allergies -     Cetirizine  HCl; Take 1 tablet (10 mg total) by mouth daily.  Dispense: 30 tablet; Refill: 11 -     Fluticasone  Propionate; Place 2 sprays into both nostrils daily.  Dispense: 16 g; Refill: 6  Immunization due -     Tdap vaccine greater than or equal to 7yo IM  History of gestational diabetes -     CBC with Differential/Platelet; Future -     Comprehensive metabolic panel with GFR; Future -     Hemoglobin A1c; Future -     Lipid panel; Future -     TSH; Future -     Vitamin B12; Future -     VITAMIN D  25 Hydroxy (Vit-D Deficiency, Fractures); Future    -Placed referral to orthopedics for her right elbow pain, doubt fracture given full range of motion. - Tdap administered in office today. - Orders placed for breast and colon cancer screening. - Cetirizine  and Flonase  prescribed for seasonal allergies.  Time spent: 46 minutes reviewing chart, interviewing and examining patient and formulating plan of care.        Tully Theophilus Andrews, MD [facility]

## 2024-04-26 LAB — CBC WITH DIFFERENTIAL/PLATELET
Basophils Absolute: 0.1 K/uL (ref 0.0–0.1)
Basophils Relative: 1.4 % (ref 0.0–3.0)
Eosinophils Absolute: 0.1 K/uL (ref 0.0–0.7)
Eosinophils Relative: 1.5 % (ref 0.0–5.0)
HCT: 38.6 % (ref 36.0–46.0)
Hemoglobin: 12.6 g/dL (ref 12.0–15.0)
Lymphocytes Relative: 48.2 % — ABNORMAL HIGH (ref 12.0–46.0)
Lymphs Abs: 2.5 K/uL (ref 0.7–4.0)
MCHC: 32.6 g/dL (ref 30.0–36.0)
MCV: 86.8 fl (ref 78.0–100.0)
Monocytes Absolute: 0.3 K/uL (ref 0.1–1.0)
Monocytes Relative: 6.3 % (ref 3.0–12.0)
Neutro Abs: 2.2 K/uL (ref 1.4–7.7)
Neutrophils Relative %: 42.6 % — ABNORMAL LOW (ref 43.0–77.0)
Platelets: 415 K/uL — ABNORMAL HIGH (ref 150.0–400.0)
RBC: 4.45 Mil/uL (ref 3.87–5.11)
RDW: 13.5 % (ref 11.5–15.5)
WBC: 5.3 K/uL (ref 4.0–10.5)

## 2024-04-26 LAB — LIPID PANEL
Cholesterol: 196 mg/dL (ref 0–200)
HDL: 44.4 mg/dL (ref >39.00)
LDL Cholesterol: 112 mg/dL — ABNORMAL HIGH (ref 0–99)
NonHDL: 151.59
Total CHOL/HDL Ratio: 4
Triglycerides: 197 mg/dL — ABNORMAL HIGH (ref 0.0–149.0)
VLDL: 39.4 mg/dL (ref 0.0–40.0)

## 2024-04-26 LAB — COMPREHENSIVE METABOLIC PANEL WITH GFR
ALT: 19 U/L (ref 0–35)
AST: 22 U/L (ref 0–37)
Albumin: 3.9 g/dL (ref 3.5–5.2)
Alkaline Phosphatase: 73 U/L (ref 39–117)
BUN: 11 mg/dL (ref 6–23)
CO2: 25 meq/L (ref 19–32)
Calcium: 8.8 mg/dL (ref 8.4–10.5)
Chloride: 103 meq/L (ref 96–112)
Creatinine, Ser: 0.72 mg/dL (ref 0.40–1.20)
GFR: 100.16 mL/min (ref >60.00)
Glucose, Bld: 200 mg/dL — ABNORMAL HIGH (ref 70–99)
Potassium: 3.5 meq/L (ref 3.5–5.1)
Sodium: 138 meq/L (ref 135–145)
Total Bilirubin: 0.3 mg/dL (ref 0.2–1.2)
Total Protein: 7 g/dL (ref 6.0–8.3)

## 2024-04-26 LAB — HEMOGLOBIN A1C: Hgb A1c MFr Bld: 7.9 % — ABNORMAL HIGH (ref 4.6–6.5)

## 2024-04-26 LAB — VITAMIN B12: Vitamin B-12: 184 pg/mL — ABNORMAL LOW (ref 211–911)

## 2024-04-26 LAB — VITAMIN D 25 HYDROXY (VIT D DEFICIENCY, FRACTURES): VITD: 16.82 ng/mL — ABNORMAL LOW (ref 30.00–100.00)

## 2024-04-26 LAB — TSH: TSH: 1.35 u[IU]/mL (ref 0.35–5.50)

## 2024-05-01 ENCOUNTER — Encounter: Payer: Self-pay | Admitting: Internal Medicine

## 2024-05-01 ENCOUNTER — Ambulatory Visit: Payer: Self-pay | Admitting: Internal Medicine

## 2024-05-01 DIAGNOSIS — E538 Deficiency of other specified B group vitamins: Secondary | ICD-10-CM | POA: Insufficient documentation

## 2024-05-01 DIAGNOSIS — E1169 Type 2 diabetes mellitus with other specified complication: Secondary | ICD-10-CM

## 2024-05-01 DIAGNOSIS — E559 Vitamin D deficiency, unspecified: Secondary | ICD-10-CM | POA: Insufficient documentation

## 2024-05-01 MED ORDER — VITAMIN D (ERGOCALCIFEROL) 1.25 MG (50000 UNIT) PO CAPS
50000.0000 [IU] | ORAL_CAPSULE | ORAL | 0 refills | Status: AC
Start: 2024-05-01 — End: 2024-07-18

## 2024-05-01 MED ORDER — METFORMIN HCL 500 MG PO TABS: 500.0000 mg | ORAL_TABLET | Freq: Two times a day (BID) | ORAL | 1 refills | Status: AC

## 2024-05-01 MED ORDER — ROSUVASTATIN CALCIUM 5 MG PO TABS: 5.0000 mg | ORAL_TABLET | Freq: Every day | ORAL | 1 refills | Status: AC

## 2024-05-07 ENCOUNTER — Telehealth: Payer: Self-pay | Admitting: *Deleted

## 2024-05-07 DIAGNOSIS — H539 Unspecified visual disturbance: Secondary | ICD-10-CM

## 2024-05-07 DIAGNOSIS — H9202 Otalgia, left ear: Secondary | ICD-10-CM

## 2024-05-07 NOTE — Telephone Encounter (Signed)
 Copied from CRM [id number]. Topic: Clinical - Medical Advice >> May 07, 2024  9:44 AM Chasity T wrote: Reason for CRM: PT is requesting for nurse to call her back for questions.

## 2024-05-07 NOTE — Telephone Encounter (Signed)
 Left message on machine for patient to return our call.  What are her questions?

## 2024-05-09 ENCOUNTER — Encounter: Payer: Self-pay | Admitting: Family Medicine

## 2024-05-09 ENCOUNTER — Ambulatory Visit (INDEPENDENT_AMBULATORY_CARE_PROVIDER_SITE_OTHER): Admitting: *Deleted

## 2024-05-09 ENCOUNTER — Telehealth: Admitting: Family Medicine

## 2024-05-09 DIAGNOSIS — E538 Deficiency of other specified B group vitamins: Secondary | ICD-10-CM

## 2024-05-09 DIAGNOSIS — R051 Acute cough: Secondary | ICD-10-CM | POA: Diagnosis not present

## 2024-05-09 DIAGNOSIS — R0981 Nasal congestion: Secondary | ICD-10-CM

## 2024-05-09 MED ORDER — CYANOCOBALAMIN 1000 MCG/ML IJ SOLN
1000.0000 ug | Freq: Once | INTRAMUSCULAR | Status: AC
  Administered 2024-05-09: 1000 ug via INTRAMUSCULAR

## 2024-05-09 MED ORDER — DOXYCYCLINE HYCLATE 100 MG PO TABS: 100.0000 mg | ORAL_TABLET | Freq: Two times a day (BID) | ORAL | 0 refills | Status: AC

## 2024-05-09 NOTE — Progress Notes (Signed)
Per orders of Dr. Legrand Como, injection of B12 given by Westley Hummer. Patient tolerated injection well.

## 2024-05-09 NOTE — Telephone Encounter (Signed)
 Spoke with patient and referrals placed.

## 2024-05-09 NOTE — Progress Notes (Signed)
 Virtual Visit via Video Note  I connected with Pamela [name]  on 05/09/24 at 12:40 PM EDT by a video enabled telemedicine application and verified that I am speaking with the correct person using two identifiers.  Location patient: [place] Location provider:work or home office Persons participating in the virtual visit: patient, provider  I discussed the limitations and requested verbal permission for telemedicine visit. The patient expressed understanding and agreed to proceed.   HPI:   -Onset: about 4 days ago -no known sick contacts -Symptoms include:nasal congestion, drainage, some body aches initially, cough, PND, ear aches - has hx of ear infections and sinus infections, she is requesting an antibiotic as she feels a sinus infection is developing because she has had some tooth pain.  -Denies: known fevers, CP, SOB, NVD -FDLMP: 04/25/24, denies any chance of pregnancy -Has tried: sudafed, ibuprofen  -Pertinent past medical history: see below -Pertinent medication allergies:  Allergies  Allergen Reactions   Cephalexin Hives    Pt says she can take PCN without any problems   -COVID-19 vaccine status:  Immunization History  Administered Date(s) Administered   Influenza Split 08/08/2011   Influenza,inj,Quad PF,6+ Mos 08/14/2013, 08/08/2017   MMR 01/04/2014   Tdap 11/25/2013, 01/04/2014, 04/25/2024     ROS: See pertinent positives and negatives per HPI.  Past Medical History:  Diagnosis Date   Allergy    RHINITIS   Diabetes mellitus    Gestational diabetes    diet controlled   Recurrent sinus infections    Thyroid disease    THYROID CYST   Vaginal Pap smear, abnormal     Past Surgical History:  Procedure Laterality Date   COLPOSCOPY  09/05/2002   shx1  N/A 12/08/2021   fibroma with numerous lipocytes     Current Outpatient Medications:    doxycycline  (VIBRA -TABS) 100 MG tablet, Take 1 tablet (100 mg total) by mouth 2 (two) times daily., Disp: 14 tablet, Rfl: 0    cetirizine  (ZYRTEC ) 10 MG tablet, Take 1 tablet (10 mg total) by mouth daily., Disp: 30 tablet, Rfl: 11   fluticasone  (FLONASE ) 50 MCG/ACT nasal spray, Place 2 sprays into both nostrils daily., Disp: 16 g, Rfl: 6   metFORMIN  (GLUCOPHAGE ) 500 MG tablet, Take 1 tablet (500 mg total) by mouth 2 (two) times daily with a meal., Disp: 180 tablet, Rfl: 1   rosuvastatin  (CRESTOR ) 5 MG tablet, Take 1 tablet (5 mg total) by mouth daily., Disp: 90 tablet, Rfl: 1   Vitamin D , Ergocalciferol , (DRISDOL ) 1.25 MG (50000 UNIT) CAPS capsule, Take 1 capsule (50,000 Units total) by mouth every 7 (seven) days for 12 doses., Disp: 12 capsule, Rfl: 0  EXAM:  VITALS per patient if applicable:  GENERAL: alert, oriented, appears well and in no acute distress  HEENT: atraumatic, conjunttiva clear, no obvious abnormalities on inspection of external nose and ears  NECK: normal movements of the head and neck  LUNGS: on inspection no signs of respiratory distress, breathing rate appears normal, no obvious gross SOB, gasping or wheezing  CV: no obvious cyanosis  MS: moves all visible extremities without noticeable abnormality  PSYCH/NEURO: pleasant and cooperative, no obvious depression or anxiety, speech and thought processing grossly intact  ASSESSMENT AND PLAN:  Discussed the following assessment and plan:  Acute cough  Nasal congestion  -we discussed possible serious and likely etiologies, options for evaluation and workup, limitations of telemedicine visit vs in person visit, treatment, treatment risks and precautions. Pt is agreeable to treatment via telemedicine at this moment. Query  VURI, covid vs other. Possible developing 2ndary bacterial infection. Discussed risks of antibiotics at length and symptomatic/supportive care. She agrees to home covid testing, trial nasal saline, symptomatic care, cutting back on sugar/healthy diet and doxy rx sent per her request in case worsening or not improving as  expected. Advised to seek prompt virtual visit or in person care if worsening, new symptoms arise, or if is not improving with treatment as expected per our conversation of expected course. Discussed options for follow up care. Did let this patient know that I do telemedicine on Tuesdays and Thursdays for [facility] and those are the days I am logged into the system. Advised to schedule follow up visit with PCP, [facility] virtual visits or [facility] if any further questions or concerns to avoid delays in care.   I discussed the assessment and treatment plan with the patient. The patient was provided an opportunity to ask questions and all were answered. The patient agreed with the plan and demonstrated an understanding of the instructions.     Chiquita JONELLE Cramp, DO

## 2024-05-09 NOTE — Patient Instructions (Addendum)
 -I sent the medication(s) we discussed to your pharmacy: Meds ordered this encounter  Medications   doxycycline  (VIBRA -TABS) 100 MG tablet    Sig: Take 1 tablet (100 mg total) by mouth 2 (two) times daily.    Dispense:  14 tablet    Refill:  0   Can try nasal saline twice daily (use per instructions on packaging), allergy meds as we discussed. Cut back on sugars, processed foods, sweets, dairy. Antibiotic (doxycycline ) if not improving over the next several days.   I hope you are feeling better soon!  Seek in person care promptly if your symptoms worsen, new concerns arise or you are not improving with treatment.  It was nice to meet you today. I help [place] out with telemedicine visits on Tuesdays and Thursdays and am happy to help if you need a virtual follow up visit on those days. Otherwise, if you have any concerns or questions following this visit please schedule a follow up visit with your Primary Care office or seek care at a local urgent care clinic to avoid delays in care. If you are having severe or life threatening symptoms please call 911 and/or go to the nearest emergency room.  Doxycycline  Capsules or Tablets What is this medication? DOXYCYCLINE  (dox i SYE kleen) treats infections caused by bacteria. It belongs to a group of medications called tetracycline antibiotics. It will not treat colds, the flu, or infections caused by viruses. This medicine may be used for other purposes; ask your health care provider or pharmacist if you have questions. COMMON BRAND NAME(S): Acticlate , Adoxa, Adoxa CK, Adoxa Pak, Adoxa TT, Alodox, Avidoxy, Doxal, LYMEPAK , Mondoxyne NL, Monodox , Morgidox 1x, Morgidox 1x Kit, Morgidox 2x, Morgidox 2x Kit, NutriDox, Ocudox, O[place]bo, Periostat , TARGADOX , Vibra -Tabs, Vibramycin  What should I tell my care team before I take this medication? They need to know if you have any of these conditions: Kidney disease Liver disease Long exposure to sunlight like  working outdoors Recent stomach surgery Stomach or intestine problems, such as colitis Vision problems Yeast or fungal infection of the mouth or vagina An unusual or allergic reaction to doxycycline , other medications, foods, dyes, or preservatives Pregnant or trying to get pregnant Breastfeeding How should I use this medication? Take this medication by mouth with water. Take it as directed on the prescription label at the same time every day. It is best to take this medication without food, but if it upsets your stomach take it with food. Take all of this medication unless your care team tells you to stop it early. Keep taking it even if you think you are better. Take antacids and products with aluminum, calcium , magnesium, iron, and zinc in them at a different time of day than this medication. Talk to your care team if you have questions. Talk to your care team about the use of this medication in children. While it may be prescribed for children for selected conditions, precautions do apply. Overdosage: If you think you have taken too much of this medicine contact a poison control center or emergency room at once. NOTE: This medicine is only for you. Do not share this medicine with others. What if I miss a dose? If you miss a dose, take it as soon as you can. If it is almost time for your next dose, take only that dose. Do not take double or extra doses. What may interact with this medication? Antacids, vitamins, or other products that contain aluminum, calcium , iron, magnesium, or zinc Barbiturates Bismuth  subsalicylate Carbamazepine Estrogen or progestin hormones Methoxyflurane Oral retinoids, such as acitretin, isotretinoin Other antibiotics Phenytoin Warfarin This list may not describe all possible interactions. Give your health care provider a list of all the medicines, herbs, non-prescription drugs, or dietary supplements you use. Also tell them if you smoke, drink alcohol , or use  illegal drugs. Some items may interact with your medicine. What should I watch for while using this medication? Tell your care team if your symptoms do not improve. Do not treat diarrhea with over the counter products. Contact your care team if you have diarrhea that lasts more than 2 days or if it is severe and watery. Do not take this medication just before going to bed. It may not dissolve properly when you lay down and can cause pain in your throat. Drink plenty of fluids while taking this medication to also help reduce irritation in your throat. This medication can make you more sensitive to the sun. Keep out of the sun. If you cannot avoid being in the sun, wear protective clothing and sunscreen. Do not use sun lamps, tanning beds, or tanning booths. Estrogen and progestin hormones may not work as well while you are taking this medication. A barrier contraceptive, such as a condom or diaphragm, is recommended if you are using these hormones for contraception. Talk to your care team about effective forms of contraception. If you are being treated for a sexually transmitted infection (STI), avoid sexual contact until you have finished your treatment. Your sexual partner may also need treatment. If you are using this medication to prevent malaria, you should still protect yourself from contact with mosquitos. Stay in screened-in areas, use mosquito nets, keep your body covered, and use an insect repellent. What side effects may I notice from receiving this medication? Side effects that you should report to your care team as soon as possible: Allergic reactions--skin rash, itching, hives, swelling of the face, lips, tongue, or throat Increased pressure around the brain--severe headache, change in vision, blurry vision, nausea, vomiting Joint pain Pain or trouble swallowing Redness, blistering, peeling, or loosening of the skin, including inside the mouth Severe diarrhea, fever Unusual vaginal  discharge, itching, or odor Side effects that usually do not require medical attention (report these to your care team if they continue or are bothersome): Change in tooth color Diarrhea Headache Heartburn Nausea This list may not describe all possible side effects. Call your doctor for medical advice about side effects. You may report side effects to FDA at 1-800-FDA-1088. Where should I keep my medication? Keep out of the reach of children and pets. Store at room temperature, below 30 degrees C (86 degrees F). Protect from light. Keep container tightly closed. Throw away any unused medication after the expiration date. Taking this medication after the expiration date can make you seriously ill. NOTE: This sheet is a summary. It may not cover all possible information. If you have questions about this medicine, talk to your doctor, pharmacist, or health care provider.  2024 Elsevier/Gold Standard (2022-05-25 00:00:00)

## 2024-05-16 ENCOUNTER — Ambulatory Visit (INDEPENDENT_AMBULATORY_CARE_PROVIDER_SITE_OTHER): Admitting: *Deleted

## 2024-05-16 DIAGNOSIS — E538 Deficiency of other specified B group vitamins: Secondary | ICD-10-CM

## 2024-05-16 MED ORDER — CYANOCOBALAMIN 1000 MCG/ML IJ SOLN
1000.0000 ug | Freq: Once | INTRAMUSCULAR | Status: AC
  Administered 2024-05-16: 1000 ug via INTRAMUSCULAR

## 2024-05-16 NOTE — Progress Notes (Signed)
Per orders of Dr. Hernandez, injection of b12 given by Chetan Mehring. Patient tolerated injection well.  

## 2024-05-23 ENCOUNTER — Ambulatory Visit (INDEPENDENT_AMBULATORY_CARE_PROVIDER_SITE_OTHER): Admitting: *Deleted

## 2024-05-23 DIAGNOSIS — E538 Deficiency of other specified B group vitamins: Secondary | ICD-10-CM

## 2024-05-23 MED ORDER — CYANOCOBALAMIN 1000 MCG/ML IJ SOLN
1000.0000 ug | Freq: Once | INTRAMUSCULAR | Status: AC
  Administered 2024-05-23: 1000 ug via INTRAMUSCULAR

## 2024-05-23 NOTE — Progress Notes (Signed)
 Per orders of Dr. Ardyth Harps, injection of B12 given by Kern Reap. Patient tolerated injection well.

## 2024-05-30 ENCOUNTER — Ambulatory Visit

## 2024-05-30 ENCOUNTER — Ambulatory Visit (INDEPENDENT_AMBULATORY_CARE_PROVIDER_SITE_OTHER): Admitting: *Deleted

## 2024-05-30 DIAGNOSIS — E538 Deficiency of other specified B group vitamins: Secondary | ICD-10-CM | POA: Diagnosis not present

## 2024-05-30 MED ORDER — CYANOCOBALAMIN 1000 MCG/ML IJ SOLN
1000.0000 ug | Freq: Once | INTRAMUSCULAR | Status: AC
  Administered 2024-05-30: 1000 ug via INTRAMUSCULAR

## 2024-05-30 NOTE — Progress Notes (Signed)
 Per orders of Dr. Ardyth Harps, injection of B12 given by Kern Reap. Patient tolerated injection well.

## 2024-06-04 ENCOUNTER — Other Ambulatory Visit: Payer: Self-pay

## 2024-06-04 ENCOUNTER — Ambulatory Visit: Admitting: Orthopedic Surgery

## 2024-06-04 DIAGNOSIS — M25521 Pain in right elbow: Secondary | ICD-10-CM | POA: Diagnosis not present

## 2024-06-04 NOTE — Progress Notes (Signed)
 Pamela [name] - 48 y.o. female MRN [id number]  Date of birth: July 30, 1976  Office Visit Note: Visit Date: 06/04/2024 PCP: Pamela [name], Tully GRADE, MD Referred by: Pamela [name], Estel*  Subjective: No chief complaint on file.  HPI: Pamela [name] is a pleasant 48 y.o. female who presents today for right elbow pain and discomfort, mostly at the posterior aspect. Had a fall back in December of 2024 and never sought treatment for the elbow. She describes the pain as a pulling sensation when she flexes the arm. She has occasional numbness/tingling.   Pertinent ROS were reviewed with the patient and found to be negative unless otherwise specified above in HPI.   Visit Reason: right elbow  Duration of symptoms:December 2024 Hand dominance: right Occupation: Driver/ Case Manager  Diabetic: Yes/ 7.9 Smoking: Yes Heart/Lung History: none Blood Thinners:  none  Prior Testing/EMG: none Injections (Date): none Treatments: none Prior Surgery: none    Assessment & Plan: Visit Diagnoses:  1. Pain in right elbow     Plan: Based on clinical examination today, she has likely created some triceps tendinitis at the posterior aspect of the elbow as well as an element of lateral epicondylitis with some soreness at the lateral region and pain with resisted wrist extension.  I do not see any significant evidence of traumatic cubital tunnel syndrome based on examination today.  Given the chronicity of her injury and the ongoing soreness and stiffness, I do feel she is an appropriate candidate for occupational therapy range of motion exercises of the right elbow with gradual progression of strengthening.  She can likely be transition to a home exercise program quite early in that process.  X-rays were reviewed today which do not show any significant bony abnormalities of the right elbow.  She is welcome to follow-up with me as needed in the future should symptoms persist or worsen.  She expressed  full understanding.  Follow-up: No follow-ups on file.   Meds & Orders: No orders of the defined types were placed in this encounter.   Orders Placed This Encounter  Procedures   XR Elbow Complete Right (3+View)   Ambulatory referral to Occupational Therapy     Procedures: No procedures performed      Clinical History: No specialty comments available.  She reports that she has been smoking cigars. She has never used smokeless tobacco.  Recent Labs    04/25/24 1624  HGBA1C 7.9*    Objective:   Vital Signs: There were no vitals taken for this visit.  Physical Exam  Gen: Well-appearing, in no acute distress; non-toxic CV: Regular Rate. Well-perfused. Warm.  Resp: Breathing unlabored on room air; no wheezing. Psych: Fluid speech in conversation; appropriate affect; normal thought process  Ortho Exam Right elbow: - Moderate tenderness over the posterior aspect of the elbow, near the triceps insertional site - Able to perform full elbow range of motion 0-1 50 flexion/extension, pronation/supination 80/80 without restriction, composite fist without restriction - Moderate tenderness over the lateral epicondylar region, mild pain with resisted wrist extension - Hand remains warm well-perfused, sensation intact in all distributions, negative Tinel's over the cubital tunnel, no evidence of nerve subluxation with range of motion  Imaging: XR Elbow Complete Right (3+View) Result Date: 06/04/2024 There is no evidence of fracture or dislocation. There is no evidence of arthropathy or other focal bone abnormality. Soft tissues are unremarkable.    Past Medical/Family/Surgical/Social History: Medications & Allergies reviewed per EMR, new medications updated. 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 present 06/11/2019   Dysmenorrhea 08/08/2017   Past Medical History:  Diagnosis Date   Allergy    RHINITIS   Diabetes mellitus    Gestational diabetes    diet controlled   Recurrent sinus infections    Thyroid  disease    THYROID  CYST   Vaginal Pap smear, abnormal    Family History  Problem Relation Age of Onset   Diabetes Father    Heart disease Father    Hypertension Father    Prostate cancer Father    Stroke Father    Asthma Sister    Hypertension Sister    Arthritis Paternal Grandmother    Hypertension Paternal Grandmother    Diabetes Paternal Grandmother    Stroke Paternal Grandmother    Arthritis Paternal Grandfather    Hypertension Paternal Grandfather    Multiple sclerosis Mother    Past Surgical History:  Procedure Laterality Date   COLPOSCOPY  09/05/2002   shx1  N/A 12/08/2021   fibroma with numerous lipocytes   Social History   Occupational History   Occupation: CASE M[employer]    Employer: [employer]  Tobacco Use   Smoking status: Some Days    Types: Cigars    Last attempt to quit: 02/02/2023    Years since quitting: 1.3   Smokeless tobacco: Never   Tobacco comments:    smoked socially for a few yrs  Vaping Use   Vaping status: Never Used  Substance and Sexual Activity   Alcohol  use: Yes    Comment: socially   Drug use: No   Sexual activity: Yes    Birth control/protection: None    Pamela [name] Pamela [name], M.D. [facility], Hand Surgery

## 2024-06-18 ENCOUNTER — Encounter (INDEPENDENT_AMBULATORY_CARE_PROVIDER_SITE_OTHER): Payer: Self-pay

## 2024-06-18 ENCOUNTER — Ambulatory Visit (INDEPENDENT_AMBULATORY_CARE_PROVIDER_SITE_OTHER)

## 2024-06-18 VITALS — BP 136/87 | HR 86 | Temp 98.2°F

## 2024-06-18 DIAGNOSIS — H608X3 Other otitis externa, bilateral: Secondary | ICD-10-CM | POA: Diagnosis not present

## 2024-06-18 DIAGNOSIS — H938X2 Other specified disorders of left ear: Secondary | ICD-10-CM | POA: Diagnosis not present

## 2024-06-18 DIAGNOSIS — L299 Pruritus, unspecified: Secondary | ICD-10-CM

## 2024-06-18 DIAGNOSIS — J329 Chronic sinusitis, unspecified: Secondary | ICD-10-CM | POA: Diagnosis not present

## 2024-06-18 MED ORDER — FLUOCINOLONE ACETONIDE 0.01 % EX CREA: TOPICAL_CREAM | Freq: Two times a day (BID) | CUTANEOUS | 0 refills | Status: AC

## 2024-06-18 NOTE — Progress Notes (Signed)
 Dear Dr. Theophilus Andrews, Here is my assessment for our mutual patient, Mindel Friscia. Thank you for allowing me the opportunity to care for your patient. Please do not hesitate to contact me should you have any other questions. Sincerely, Dr. Penne Croak  Otolaryngology Clinic Note Referring provider: Dr. Theophilus Andrews HPI:  Discussed the use of AI scribe software for clinical note transcription with the patient, who gave verbal consent to proceed.  History of Present Illness Pamela [name] is a 48 year old female who presents with left ear pain and tenderness. She was referred by her new primary doctor for evaluation of her chronic ear and sinus issues.  Left otalgia and auricular tenderness - Persistent left ear pain and tenderness since 2020 - Constant ache without pain-free intervals - Tenderness exacerbated by touching the ear - Water exposure increases sensitivity - Self-described 'ear picker' - Evaluated by two primary care doctors with no abnormalities found  Aural fullness and pruritus - Sensation of clogged hearing in the left ear, but preserved hearing - Dryness and pruritus in both ear canals, left greater than right - Attributes symptoms to eczema of the ear canal - No prescribed treatments used for ear canal symptoms  Recurrent sinus pressure and infections - Recurrent sinus infections since 1997 after moving to the area - Sinus pressure sometimes radiates to the teeth - Attributes some sinus symptoms to tobacco use - No significant breathing issues - Has not taken prescribed allergy medications, does not believe allergies are the cause  Cervical lymphadenopathy - History of swelling on the right side of the neck identified by a previous physician - no pain no recent changes.   PMH/Meds/All/SocHx/FamHx/ROS:   Past Medical History:  Diagnosis Date   Allergy    RHINITIS   Diabetes mellitus    Gestational diabetes    diet controlled   Recurrent sinus  infections    Thyroid  disease    THYROID  CYST   Vaginal Pap smear, abnormal      Past Surgical History:  Procedure Laterality Date   COLPOSCOPY  09/05/2002   shx1  N/A 12/08/2021   fibroma with numerous lipocytes    Family History  Problem Relation Age of Onset   Diabetes Father    Heart disease Father    Hypertension Father    Prostate cancer Father    Stroke Father    Asthma Sister    Hypertension Sister    Arthritis Paternal Grandmother    Hypertension Paternal Grandmother    Diabetes Paternal Grandmother    Stroke Paternal Grandmother    Arthritis Paternal Grandfather    Hypertension Paternal Grandfather    Multiple sclerosis Mother      Social Connections: Not on file      Current Outpatient Medications:    fluocinolone 0.01 % cream, Apply topically 2 (two) times daily. Small amount inside both ears, Disp: 30 g, Rfl: 0   cetirizine  (ZYRTEC ) 10 MG tablet, Take 1 tablet (10 mg total) by mouth daily., Disp: 30 tablet, Rfl: 11   doxycycline  (VIBRA -TABS) 100 MG tablet, Take 1 tablet (100 mg total) by mouth 2 (two) times daily., Disp: 14 tablet, Rfl: 0   fluticasone  (FLONASE ) 50 MCG/ACT nasal spray, Place 2 sprays into both nostrils daily., Disp: 16 g, Rfl: 6   metFORMIN  (GLUCOPHAGE ) 500 MG tablet, Take 1 tablet (500 mg total) by mouth 2 (two) times daily with a meal., Disp: 180 tablet, Rfl: 1   rosuvastatin  (CRESTOR ) 5 MG tablet, Take 1 tablet (  5 mg total) by mouth daily., Disp: 90 tablet, Rfl: 1   Vitamin D , Ergocalciferol , (DRISDOL ) 1.25 MG (50000 UNIT) CAPS capsule, Take 1 capsule (50,000 Units total) by mouth every 7 (seven) days for 12 doses., Disp: 12 capsule, Rfl: 0   Physical Exam:   BP 136/87   Pulse 86   Temp 98.2 F (36.8 C)   SpO2 98%   The patient was awake, alert, and appropriate. The external ears were inspected, and otoscopy was performed to evaluate the external auditory canals and tympanic membranes. The nasal cavity and septum were examined for  mucosal changes, obstruction, or discharge. The oral cavity and oropharynx were inspected for mucosal lesions, infection, or tonsillar hypertrophy. The neck was palpated for lymphadenopathy, thyroid  abnormalities, or other masses. Cranial nerve function was grossly intact.  Pertinent Findings: Physical Exam HEENT: EAC with ezcematous changes. TM clear with normal landmarks. Nasal septum deviated to the left. NECK: No neck swelling. Thyroid  normal. Jaw joint without pain or abnormalities.   Seprately Identifiable Procedures:  I personally ordered, reviewed and interpreted the following with the patient today  Procedure: Bilateral ear microscopy using microscope (CPT [phone]) Pre-procedure diagnosis: ear pain Post-procedure diagnosis: same Indication: see above; given patient's otologic complaints and history, for improved and comprehensive examination of external ear and tympanic membrane, bilateral otologic examination using microscope was performed. Prior to proceeding, verbal consent was obtained after discussion of R/B/A  Procedure: Patient was placed semi-recumbent. Both ear canals were examined using the microscope with findings below. Patient tolerated the procedure well.  Right ear:  No significant lesions pinna. EAC: no significant lesions. Canal is clear. Eczematoid changes. yes TM: Intact   Left ear:  No significant lesions pinna. EAC: no significant lesions. Canal is clear. Eczematoid changes. yes TM: Intact    Impression & Plans:  Pamela [name] is a 48 y.o. female  1. Ear fullness, left   2. Chronic eczematous otitis externa of both ears   3. Ear itch   4. Recurrent sinusitis    - Findings and diagnoses discussed in detail with the patient. - Risks, benefits, and alternatives were reviewed. Through shared decision making, the patient elects to proceed with below. 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 Left ear more affected with tenderness and fullness. Eczema suspected as primary cause. - Prescribe topical cream for ear canal eczema, apply twice daily initially, taper to once daily, then every other day as symptoms improve. - Schedule hearing test to assess hearing loss related to chronic ear condition. - Advise against ear picking and Q-tip use inside ear canal. Apply cream with finger or Q-tip around outer ear canal. - Instruct to keep pain frequency journal to monitor symptoms.  Chronic/recurrent sinus infections Chronic sinus infections with pressure and occasional dental pain, likely related to smoking and possibly a deviated septum. No typical allergy symptoms despite primary care suggestion.  - Orders placed:  Orders Placed This Encounter  Procedures   Ambulatory referral to Audiology   - Medications prescribed/continued/adjusted:  Meds ordered this encounter  Medications   fluocinolone 0.01 % cream    Sig: Apply topically 2 (two) times daily. Small amount inside both ears    Dispense:  30 g    Refill:  0   - Education materials provided to the patient. - Follow up: 2 months. Patient instructed to return sooner or go  to the ED if new/worsening symptoms develop.   Thank you for allowing me the opportunity to care for your patient. Please do not hesitate to contact me should you have any other questions.  Sincerely, Penne Croak, DO Otolaryngologist (ENT) [facility] Phone: [phone] Fax: [phone]  06/18/2024, 2:39 PM

## 2024-06-19 ENCOUNTER — Other Ambulatory Visit (INDEPENDENT_AMBULATORY_CARE_PROVIDER_SITE_OTHER): Payer: Self-pay

## 2024-06-19 MED ORDER — BETAMETHASONE VALERATE 0.1 % EX OINT: 1.0000 | TOPICAL_OINTMENT | Freq: Two times a day (BID) | CUTANEOUS | 0 refills | Status: AC

## 2024-06-19 NOTE — Progress Notes (Signed)
 Ordered and sent betamethasone cream.

## 2024-06-28 ENCOUNTER — Ambulatory Visit

## 2024-07-04 ENCOUNTER — Ambulatory Visit (INDEPENDENT_AMBULATORY_CARE_PROVIDER_SITE_OTHER)

## 2024-07-04 DIAGNOSIS — E538 Deficiency of other specified B group vitamins: Secondary | ICD-10-CM | POA: Diagnosis not present

## 2024-07-04 MED ORDER — CYANOCOBALAMIN 1000 MCG/ML IJ SOLN
1000.0000 ug | Freq: Once | INTRAMUSCULAR | Status: AC
  Administered 2024-07-04: 1000 ug via INTRAMUSCULAR

## 2024-07-04 NOTE — Addendum Note (Signed)
 Addended by: VICCI LEADER R on: 07/04/2024 01:57 PM   Modules accepted: Orders

## 2024-07-04 NOTE — Progress Notes (Signed)
 Per orders of Theophilus Andrews, Tully GRADE, MD, injection of B12 given in Left deltoid by Marjorie JONELLE Louder. Patient tolerated injection well.  Lab Results  Component Value Date   VITAMINB12 184 (L) 04/25/2024

## 2024-07-04 NOTE — Patient Instructions (Signed)
 Health Maintenance Due  Topic Date Due   COVID-19 Vaccine (1) Never done   Diabetic kidney evaluation - Urine ACR  Never done   Pneumococcal Vaccine (1 of 2 - PCV) Never done   Hepatitis B Vaccines 19-59 Average Risk (1 of 3 - 19+ 3-dose series) Never done   Colonoscopy  Never done   Mammogram  11/20/2022   Influenza Vaccine  04/05/2024       04/25/2024    4:14 PM 12/08/2021    3:30 PM 11/23/2021    3:55 PM  Depression screen PHQ 2/9  Decreased Interest 0 0 0  Down, Depressed, Hopeless 0 0 0  PHQ - 2 Score 0 0 0  Altered sleeping 1 2 1   Tired, decreased energy 1 1 1   Change in appetite 0 1 1  Feeling bad or failure about yourself  0 0 0  Trouble concentrating 0 0 0  Moving slowly or fidgety/restless 0 0 0  Suicidal thoughts 0 0 0  PHQ-9 Score 2 4 3   Difficult doing work/chores  Not difficult at all Not difficult at all

## 2024-07-08 ENCOUNTER — Encounter: Payer: Self-pay | Admitting: Radiology

## 2024-07-17 ENCOUNTER — Encounter (INDEPENDENT_AMBULATORY_CARE_PROVIDER_SITE_OTHER): Payer: Self-pay

## 2024-07-17 ENCOUNTER — Telehealth (INDEPENDENT_AMBULATORY_CARE_PROVIDER_SITE_OTHER): Payer: Self-pay

## 2024-07-17 NOTE — Telephone Encounter (Signed)
 Left my chart msg: Good afternoon. Please call our office at your first availability, we need to reschedule your upcoming appointment with Dr Anice on 07/30/24.  Please call [phone].  Thanks!

## 2024-07-23 ENCOUNTER — Encounter: Payer: Self-pay | Admitting: Gastroenterology

## 2024-07-30 ENCOUNTER — Ambulatory Visit (INDEPENDENT_AMBULATORY_CARE_PROVIDER_SITE_OTHER): Admitting: Audiology

## 2024-07-30 ENCOUNTER — Ambulatory Visit (INDEPENDENT_AMBULATORY_CARE_PROVIDER_SITE_OTHER)

## 2024-08-06 ENCOUNTER — Ambulatory Visit: Admitting: Internal Medicine

## 2024-08-06 DIAGNOSIS — E119 Type 2 diabetes mellitus without complications: Secondary | ICD-10-CM

## 2024-08-20 ENCOUNTER — Ambulatory Visit (INDEPENDENT_AMBULATORY_CARE_PROVIDER_SITE_OTHER): Admitting: Audiology

## 2024-08-27 ENCOUNTER — Ambulatory Visit (INDEPENDENT_AMBULATORY_CARE_PROVIDER_SITE_OTHER)

## 2024-08-27 ENCOUNTER — Ambulatory Visit: Admitting: Internal Medicine

## 2024-09-09 NOTE — Progress Notes (Deleted)
 "  Chief Complaint: Screening for colon cancer Primary GI MD: Unassigned  HPI:  *** is a  ***  who was referred to me by Theophilus Andrews, Tully GRADE, MD for a complaint of *** .     Discussed the use of AI scribe software for clinical note transcription with the patient, who gave verbal consent to proceed.  History of Present Illness      PREVIOUS GI WORKUP     Past Medical History:  Diagnosis Date   Allergy    RHINITIS   Diabetes mellitus    Gestational diabetes    diet controlled   Recurrent sinus infections    Thyroid  disease    THYROID  CYST   Vaginal Pap smear, abnormal     Past Surgical History:  Procedure Laterality Date   COLPOSCOPY  09/05/2002   shx1  N/A 12/08/2021   fibroma with numerous lipocytes    Current Outpatient Medications  Medication Sig Dispense Refill   betamethasone  valerate ointment (VALISONE ) 0.1 % Apply 1 Application topically 2 (two) times daily. Apply to affected area twice daily for 1 to 2 weeks, do not use longer than 2 weeks 30 g 0   cetirizine  (ZYRTEC ) 10 MG tablet Take 1 tablet (10 mg total) by mouth daily. 30 tablet 11   doxycycline  (VIBRA -TABS) 100 MG tablet Take 1 tablet (100 mg total) by mouth 2 (two) times daily. 14 tablet 0   fluocinolone  0.01 % cream Apply topically 2 (two) times daily. Small amount inside both ears 30 g 0   fluticasone  (FLONASE ) 50 MCG/ACT nasal spray Place 2 sprays into both nostrils daily. 16 g 6   metFORMIN  (GLUCOPHAGE ) 500 MG tablet Take 1 tablet (500 mg total) by mouth 2 (two) times daily with a meal. 180 tablet 1   rosuvastatin  (CRESTOR ) 5 MG tablet Take 1 tablet (5 mg total) by mouth daily. 90 tablet 1   No current facility-administered medications for this visit.    Allergies as of 09/10/2024 - Review Complete 06/18/2024  Allergen Reaction Noted   Cephalexin Hives 08/04/2011    Family History  Problem Relation Age of Onset   Diabetes Father    Heart disease Father    Hypertension Father     Prostate cancer Father    Stroke Father    Asthma Sister    Hypertension Sister    Arthritis Paternal Grandmother    Hypertension Paternal Grandmother    Diabetes Paternal Grandmother    Stroke Paternal Grandmother    Arthritis Paternal Grandfather    Hypertension Paternal Grandfather    Multiple sclerosis Mother     Social History   Socioeconomic History   Marital status: Single    Spouse name: Not on file   Number of children: 1   Years of education: Not on file   Highest education level: Not on file  Occupational History   Occupation: [employer]    Employer: [employer]  Tobacco Use   Smoking status: Some Days    Types: Cigars    Last attempt to quit: 02/02/2023    Years since quitting: 1.6   Smokeless tobacco: Never   Tobacco comments:    smoked socially for a few yrs  Vaping Use   Vaping status: Never Used  Substance and Sexual Activity   Alcohol  use: Yes    Comment: socially   Drug use: No   Sexual activity: Yes    Birth control/protection: None  Other Topics Concern   Not on file  Social History Narrative   Not on file   Social Drivers of Health   Tobacco Use: High Risk (06/18/2024)   Patient History    Smoking Tobacco Use: Some Days    Smokeless Tobacco Use: Never    Passive Exposure: Not on file  Financial Resource Strain: Not on file  Food Insecurity: Not on file  Transportation Needs: Not on file  Physical Activity: Not on file  Stress: Not on file  Social Connections: Not on file  Intimate Partner Violence: Not on file  Depression (PHQ2-9): Low Risk (04/25/2024)   Depression (PHQ2-9)    PHQ-2 Score: 2  Alcohol  Screen: Not on file  Housing: Not on file  Utilities: Not on file  Health Literacy: Not on file    Review of Systems:    Constitutional: No weight loss, fever, chills, weakness or fatigue HEENT: Eyes: No change in vision               Ears, Nose, Throat:  No change in hearing or congestion Skin: No rash or itching Cardiovascular: No  chest pain, chest pressure or palpitations   Respiratory: No SOB or cough Gastrointestinal: See HPI and otherwise negative Genitourinary: No dysuria or change in urinary frequency Neurological: No headache, dizziness or syncope Musculoskeletal: No new muscle or joint pain Hematologic: No bleeding or bruising Psychiatric: No history of depression or anxiety    Physical Exam:  Vital signs: There were no vitals taken for this visit.  Constitutional: NAD, alert and cooperative Head:  Normocephalic and atraumatic. Eyes:   PEERL, EOMI. No icterus. Conjunctiva pink. Respiratory: Respirations even and unlabored. Lungs clear to auscultation bilaterally.   No wheezes, crackles, or rhonchi.  Cardiovascular:  Regular rate and rhythm. No peripheral edema, cyanosis or pallor.  Gastrointestinal:  Soft, nondistended, nontender. No rebound or guarding. Normal bowel sounds. No appreciable masses or hepatomegaly. Rectal:  Declines Msk:  Symmetrical without gross deformities. Without edema, no deformity or joint abnormality.  Neurologic:  Alert and  oriented x4;  grossly normal neurologically.  Skin:   Dry and intact without significant lesions or rashes. Psychiatric: Oriented to person, place and time. Demonstrates good judgement and reason without abnormal affect or behaviors.  Physical Exam    RELEVANT LABS AND IMAGING: CBC    Component Value Date/Time   WBC 5.3 04/25/2024 1624   RBC 4.45 04/25/2024 1624   HGB 12.6 04/25/2024 1624   HCT 38.6 04/25/2024 1624   PLT 415.0 (H) 04/25/2024 1624   MCV 86.8 04/25/2024 1624   MCH 28.5 04/08/2023 1847   MCHC 32.6 04/25/2024 1624   RDW 13.5 04/25/2024 1624   LYMPHSABS 2.5 04/25/2024 1624   MONOABS 0.3 04/25/2024 1624   EOSABS 0.1 04/25/2024 1624   BASOSABS 0.1 04/25/2024 1624    CMP     Component Value Date/Time   NA 138 04/25/2024 1624   K 3.5 04/25/2024 1624   CL 103 04/25/2024 1624   CO2 25 04/25/2024 1624   GLUCOSE 200 (H) 04/25/2024  1624   GLUCOSE 97 11/29/2013 1117   BUN 11 04/25/2024 1624   CREATININE 0.72 04/25/2024 1624   CALCIUM  8.8 04/25/2024 1624   PROT 7.0 04/25/2024 1624   ALBUMIN 3.9 04/25/2024 1624   AST 22 04/25/2024 1624   ALT 19 04/25/2024 1624   ALKPHOS 73 04/25/2024 1624   BILITOT 0.3 04/25/2024 1624   GFRNONAA >60 04/08/2023 1847   GFRAA >90 04/15/2014 2129     Assessment/Plan:   Assessment and Plan Assessment &  Plan        Easton Sivertson Mollie RIGGERS [facility] 09/09/2024, 10:27 AM  Cc: Theophilus Andrews, Tully GRADE, MD "

## 2024-09-10 ENCOUNTER — Ambulatory Visit: Admitting: Gastroenterology
